# Patient Record
Sex: Male | Born: 1965 | Race: White | Hispanic: Yes | Marital: Married | Smoking: Never smoker
Health system: Southern US, Community
[De-identification: ages and names within clinical notes are randomized; demographics above are authoritative.]

## PROBLEM LIST (undated history)

## (undated) DIAGNOSIS — E7800 Pure hypercholesterolemia, unspecified: Secondary | ICD-10-CM

## (undated) DIAGNOSIS — I10 Essential (primary) hypertension: Secondary | ICD-10-CM

## (undated) HISTORY — DX: Essential (primary) hypertension: I10

## (undated) HISTORY — DX: Pure hypercholesterolemia, unspecified: E78.00

---

## 2007-09-30 HISTORY — PX: COLONOSCOPY: SHX174

## 2008-06-28 ENCOUNTER — Ambulatory Visit: Payer: Self-pay | Admitting: Gastroenterology

## 2008-07-14 ENCOUNTER — Ambulatory Visit: Payer: Self-pay | Admitting: Gastroenterology

## 2008-07-14 ENCOUNTER — Encounter: Payer: Self-pay | Admitting: Gastroenterology

## 2008-07-14 ENCOUNTER — Ambulatory Visit (HOSPITAL_COMMUNITY): Admission: RE | Admit: 2008-07-14 | Discharge: 2008-07-14 | Payer: Self-pay | Admitting: Gastroenterology

## 2008-12-12 ENCOUNTER — Telehealth (INDEPENDENT_AMBULATORY_CARE_PROVIDER_SITE_OTHER): Payer: Self-pay

## 2009-01-09 DIAGNOSIS — Z8719 Personal history of other diseases of the digestive system: Secondary | ICD-10-CM

## 2009-01-09 DIAGNOSIS — R3915 Urgency of urination: Secondary | ICD-10-CM | POA: Insufficient documentation

## 2009-01-09 DIAGNOSIS — B820 Intestinal helminthiasis, unspecified: Secondary | ICD-10-CM

## 2009-01-10 ENCOUNTER — Ambulatory Visit: Payer: Self-pay | Admitting: Gastroenterology

## 2009-01-10 DIAGNOSIS — K648 Other hemorrhoids: Secondary | ICD-10-CM

## 2009-01-10 DIAGNOSIS — D126 Benign neoplasm of colon, unspecified: Secondary | ICD-10-CM | POA: Insufficient documentation

## 2011-02-11 NOTE — H&P (Signed)
NAME:  Bobby Newton, Bobby Newton             ACCOUNT NO.:  [id number]   MEDICAL RECORD NO.:  [id number]          PATIENT TYPE:  AMB   LOCATION:  DAY                           FACILITY:  [facility]   PHYSICIAN:  Kassie Mends, M.D.      DATE OF BIRTH:  1966-05-04   DATE OF ADMISSION:  DATE OF DISCHARGE:  LH                              HISTORY & PHYSICAL   PRIMARY CARE PHYSICIAN:  [facility] Department physician.   CHIEF COMPLAINT:  Hematochezia.   HISTORY OF PRESENT ILLNESS:  Bobby Newton is a 45 year old Hispanic male.  He has a 3-year history of intermittent hematochezia.  It initially  began as a small volume with wiping on the toilet tissue and it was very  rare and he had very rare episode.  More recently, he has been having  large amounts of bright red blood on the toilet tissue with wiping.  He  felt he may have had a hemorrhoid.  He did use some over-the-counter  treatment.  He has also used an over-the-counter powder for  constipation.  He tells me he is having about 3 soft brown bowel  movements per day and intermittently is noticing large amounts of bright  red blood mixed in with the stool and on the toilet paper.  He denies  any nausea or vomiting.  Denies any abdominal pain.  Denies any  proctalgia.  Denies any fever or chills.  He denies any history of  diarrhea.  Denies any heartburn or indigestion.   PAST MEDICAL AND SURGICAL HISTORY:  1. Constipation.  2. Urinary urgency.   CURRENT MEDICATIONS:  Denies any.   ALLERGIES:  No known drug allergies.   FAMILY HISTORY:  There is no known family history of colorectal  carcinoma, liver, or chronic GI problems.  Mother deceased at 78,  unknown etiology.  Father is alive, but the patient is not sure about  any medical problems.  He has 6 healthy siblings.   SOCIAL HISTORY:  Bobby Newton is married.  He has 2 healthy children.  He is  a Therapist, occupational.  He denies any tobacco or drug use.  He rarely  consumes alcohol.   REVIEW OF SYSTEMS:  See HPI, otherwise, negative.   PHYSICAL EXAMINATION:  VITAL SIGNS:  Weight is 160.5 pounds, height 64-  1/2 inches, temp 98.5, blood pressure 140/94, and pulse 56.  GENERAL:  He is a 45 year old Hispanic male who is alert, pleasant, and  cooperative.  He has brought his daughter, Giorgio Chabot along in order to  translate.  HEENT:  Sclerae clear, nonicteric.  Conjunctivae pink.  Oropharynx pink  and moist without any lesions.  NECK:  Supple without any mass or thyromegaly.  HEART:  Regular rate and rhythm.  Normal S1 and S2 without murmurs,  clicks, rubs, or gallops.  LUNGS:  Clear to auscultation bilaterally.  ABDOMEN:  Positive bowel sounds x4.  No bruits auscultated.  Soft,  nontender, and nondistended without palpable mass or hepatosplenomegaly.  No rebound tenderness or guarding.  EXTREMITIES:  Without clubbing or edema.  RECTAL:  No external lesions visualized.  Good sphincter tone.  No  internal masses palpated.  A small amount of medium brown stool was  obtained for the vault, which was Hemoccult negative.   IMPRESSION:  Bobby Newton is a 45 year old Hispanic male with a 3-year  history of intermittent hematochezia, which has worsened over the last 3  years.  He has a benign rectal exam in the office today and is going to  require further evaluation to determine the culprit of his rectal  bleeding.  He may have a benign anorectal source such as fissure or  internal hemorrhoids as the culprit, cannot rule out diverticulitis, or  colorectal carcinoma or polyps without further evaluation with  colonoscopy.   PLAN:  Colonoscopy with Dr. Cira Servant in the near future.  I have discussed  the procedure including the risks and benefits include but not limited  to bleeding, infection, perforation, and drug reaction.  He agrees with  the plan and consent will be obtained.      Lorenza Burton, N.P.      Kassie Mends, M.D.  Electronically Signed    KJ/MEDQ  D:   06/28/2008  T:  06/28/2008  Job:  [id number]   cc:   [facility] Department

## 2011-02-14 NOTE — Op Note (Signed)
Bobby Newton, Bobby Newton             ACCOUNT NO.:  [id number]   MEDICAL RECORD NO.:  [id number]          PATIENT TYPE:  AMB   LOCATION:  DAY                           FACILITY:  [facility]   PHYSICIAN:  Kassie Mends, M.D.      DATE OF BIRTH:  02-10-1966   DATE OF PROCEDURE:  DATE OF DISCHARGE:  07/14/2008                               OPERATIVE REPORT   REFERRING Demar Shad:  [facility] Department.   PROCEDURE:  Ileocolonoscopy with snare cautery and cold forceps  polypectomy, as well as stool collected for O&P.   INDICATION FOR EXAM:  Bobby Newton is a 45 year old male who presented with  a 3-year history of intermittent rectal bleeding.   FINDINGS:  1. 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      Otherwise, no erythema, erosions, or mass seen.  2. A 3-mm descending colon polyp removed via cold forceps.  3. An 8-mm sessile rectal polyp removed via snare cautery.  Otherwise,      no inflammatory changes, masses, diverticular AVMs seen.  4. Normal retroflexed view of the rectum.   DIAGNOSES:  1. Tape worm infection.  2. Rectal polyp likely source of intermittent rectal bleeding.   RECOMMENDATIONS:  1. Colonoscopy in 5 years.  2. Will call Bobby Newton with the results of his biopsies.  3. He should follow a high-fiber diet.  He is given a handout on high-      fiber diet and polyps.  No aspirin, NSAIDs, or anticoagulation for      5 days.  4. He is to take praziquantel 600 mg p.o. x1.  5. The discharge instructions were given via Josephina Gip.  Will call her      at [phone] with the results of his biopsies.  The entire family      should be screened for tape worms.  He was asymptomatic.   MEDICATIONS:  1. Demerol IV 75 mg IV.  2. Versed 4 mg IV.   PROCEDURE TECHNIQUE:  Physical exam was performed.  Informed consent was  obtained from the patient after explaining benefits, risks, and  alternatives to the procedure.  The patient was connected to the monitor  and placed in the left lateral position.  Continuous oxygen was provided  by nasal cannula and IV medicine administered through an indwelling  pain.  After administration of sedation and rectal exam, the patient's  rectum was intubated and the scope was advanced under direct  visualization to the distal terminal ileum.  The scope was removed  slowly by careful examining the color, texture, anatomy and integrity of  the mucosa on the way out.  The patient was recovered in endoscopy and  discharged home in satisfactory condition.   PATH:  Simple adenoma. TCS in 5 years. High fiber diet. First degree  relatives need TCS at age 83. Attempted to contact patient at [phone].  No answer. No asnwering machine. Send letter to ask patient to contact  office for results.      Kassie Mends, M.D.  Electronically Signed     SM/MEDQ  D:  07/18/2008  T:  07/19/2008  Job:  [id number]   cc:   [facility] Department

## 2011-07-01 LAB — OVA AND PARASITE EXAMINATION: Ova and parasites: NONE SEEN

## 2013-07-15 ENCOUNTER — Encounter: Payer: Self-pay | Admitting: Gastroenterology

## 2014-04-12 ENCOUNTER — Encounter: Payer: Self-pay | Admitting: *Deleted

## 2014-05-22 ENCOUNTER — Ambulatory Visit (INDEPENDENT_AMBULATORY_CARE_PROVIDER_SITE_OTHER): Payer: Self-pay | Admitting: Gastroenterology

## 2014-05-22 ENCOUNTER — Encounter: Payer: Self-pay | Admitting: Gastroenterology

## 2014-05-22 ENCOUNTER — Encounter (INDEPENDENT_AMBULATORY_CARE_PROVIDER_SITE_OTHER): Payer: Self-pay

## 2014-05-22 VITALS — BP 104/62 | HR 52 | Temp 97.6°F | Ht 64.0 in | Wt 160.6 lb

## 2014-05-22 DIAGNOSIS — K625 Hemorrhage of anus and rectum: Secondary | ICD-10-CM | POA: Insufficient documentation

## 2014-05-22 DIAGNOSIS — D126 Benign neoplasm of colon, unspecified: Secondary | ICD-10-CM

## 2014-05-22 MED ORDER — PEG 3350-KCL-NA BICARB-NACL 420 G PO SOLR: 4000.0000 mL | ORAL | Status: DC

## 2014-05-22 MED ORDER — HYDROCORTISONE 2.5 % RE CREA: 1.0000 "application " | TOPICAL_CREAM | Freq: Two times a day (BID) | RECTAL | Status: DC

## 2014-05-22 NOTE — Patient Instructions (Signed)
Avoid straining. Use the cream I sent to the pharmacy twice a day for 7 days.   We have scheduled you for a colonoscopy with Dr. Oneida Alar in the near future!

## 2014-05-22 NOTE — Progress Notes (Signed)
    Primary Care Physician:  Soyla Dryer, PA Primary Gastroenterologist:  Dr. Oneida Alar   Chief Complaint  Patient presents with  . Rectal Bleeding    HPI:   Bobby Newton presents today at the request of Soyla Dryer, PA, secondary to need for surveillance colonoscopy. History of simple adenoma in 2009. Plans were for 5 year surveillance.  Non-English speaking. Needs translator. Notes "blister" at rectum, painful. Associated itching. No diarrhea or constipation. Notes BM usually 3 times daily. Feels like the blister fills up with blood like a bag, then ruptures. Feels like a rash. No upper GI symptoms.   Past Medical History  Diagnosis Date  . Hypercholesterolemia   . Hypertension     Past Surgical History  Procedure Laterality Date  . Colonoscopy  2009    Dr. Oneida Alar: multiple tape worms in small intestine and colon. simple adenoma. Surveillance in 5 years    Current Outpatient Prescriptions  Medication Sig Dispense Refill  . simvastatin (ZOCOR) 20 MG tablet Take 20 mg by mouth daily.      . hydrocortisone (ANUSOL-HC) 2.5 % rectal cream Place 1 application rectally 2 (two) times daily.  30 g  1   No current facility-administered medications for this visit.    Allergies as of 05/22/2014  . (No Known Allergies)    Family History  Problem Relation Age of Onset  . Colon cancer Neg Hx     History   Social History  . Marital Status: Married    Spouse Name: N/A    Number of Children: N/A  . Years of Education: N/A   Occupational History  . Not on file.   Social History Main Topics  . Smoking status: Never Smoker   . Smokeless tobacco: Not on file  . Alcohol Use: No  . Drug Use: No  . Sexual Activity: Not on file   Other Topics Concern  . Not on file   Social History Narrative  . No narrative on file    Review of Systems: As mentioned in HPI.   Physical Exam: BP 104/62  Pulse 52  Temp(Src) 97.6 F (36.4 C) (Oral)  Ht 5\' 4"  (1.626 m)  Wt 160  lb 9.6 oz (72.848 kg)  BMI 27.55 kg/m2 General:   Alert and oriented. Pleasant and cooperative. Well-nourished and well-developed.  Head:  Normocephalic and atraumatic. Eyes:  Without icterus, sclera clear and conjunctiva pink.  Ears:  Normal auditory acuity. Nose:  No deformity, discharge,  or lesions. Mouth:  No deformity or lesions, oral mucosa pink.  Lungs:  Clear to auscultation bilaterally. No wheezes, rales, or rhonchi. No distress.  Heart:  S1, S2 present without murmurs appreciated.  Abdomen:  +BS, soft, non-tender and non-distended. No HSM noted. No guarding or rebound. No masses appreciated.  Rectal:  Small non-thrombosed external hemorrhoid, tiny pea-shaped at 6 o'clock. Internal exam without mass or stricture, no gross blood noted. Query internal hemorrhoids Msk:  Symmetrical without gross deformities. Normal posture. Extremities:  Without clubbing or edema. Neurologic:  Alert and  oriented x4;  grossly normal neurologically. Skin:  Intact without significant lesions or rashes. Psych:  Alert and cooperative. Normal mood and affect.

## 2014-05-24 ENCOUNTER — Encounter (HOSPITAL_COMMUNITY): Payer: Self-pay | Admitting: Pharmacy Technician

## 2014-05-25 ENCOUNTER — Encounter: Payer: Self-pay | Admitting: Gastroenterology

## 2014-05-25 NOTE — Assessment & Plan Note (Signed)
Due for surveillance now, with last colonoscopy in 2009.  Proceed with colonoscopy with Dr. Oneida Alar in the near future. The risks, benefits, and alternatives have been discussed in detail with the patient. They state understanding and desire to proceed.

## 2014-05-25 NOTE — Assessment & Plan Note (Signed)
Likely related to internal hemorrhoids. Rectal exam without concerning findings; external hemorrhoid noted but non-thrombosed. Anusol cream BID for supportive measures. May be good candidate for CRH banding.   Proceed with colonoscopy with Dr. Oneida Alar in the near future. The risks, benefits, and alternatives have been discussed in detail with the patient. They state understanding and desire to proceed.

## 2014-05-30 NOTE — Progress Notes (Signed)
Cc to pcp °

## 2014-06-07 ENCOUNTER — Encounter (HOSPITAL_COMMUNITY): Payer: Self-pay | Admitting: *Deleted

## 2014-06-07 ENCOUNTER — Ambulatory Visit (HOSPITAL_COMMUNITY)
Admission: RE | Admit: 2014-06-07 | Discharge: 2014-06-07 | Disposition: A | Discharge status: Home / Self Care | Payer: Self-pay | Source: Ambulatory Visit | Attending: Gastroenterology | Admitting: Gastroenterology

## 2014-06-07 ENCOUNTER — Encounter (HOSPITAL_COMMUNITY)
Admission: RE | Disposition: A | Discharge status: Home / Self Care | Payer: Self-pay | Source: Ambulatory Visit | Attending: Gastroenterology

## 2014-06-07 DIAGNOSIS — D126 Benign neoplasm of colon, unspecified: Secondary | ICD-10-CM | POA: Insufficient documentation

## 2014-06-07 DIAGNOSIS — K648 Other hemorrhoids: Secondary | ICD-10-CM | POA: Insufficient documentation

## 2014-06-07 DIAGNOSIS — K625 Hemorrhage of anus and rectum: Secondary | ICD-10-CM

## 2014-06-07 HISTORY — PX: COLONOSCOPY: SHX5424

## 2014-06-07 SURGERY — COLONOSCOPY
Anesthesia: Moderate Sedation

## 2014-06-07 MED ORDER — ATROPINE SULFATE 1 MG/ML IJ SOLN
INTRAMUSCULAR | Status: AC
  Filled 2014-06-07: qty 1

## 2014-06-07 MED ORDER — MEPERIDINE HCL 100 MG/ML IJ SOLN
INTRAMUSCULAR | Status: DC | PRN
  Administered 2014-06-07 (×2): 25 mg via INTRAVENOUS

## 2014-06-07 MED ORDER — SODIUM CHLORIDE 0.9 % IV SOLN
INTRAVENOUS | Status: DC
  Administered 2014-06-07: 08:00:00 via INTRAVENOUS

## 2014-06-07 MED ORDER — ATROPINE SULFATE 1 MG/ML IJ SOLN
INTRAMUSCULAR | Status: DC | PRN
  Administered 2014-06-07: 1 mg via INTRAVENOUS

## 2014-06-07 MED ORDER — STERILE WATER FOR IRRIGATION IR SOLN
Status: DC | PRN
  Administered 2014-06-07: 09:00:00

## 2014-06-07 MED ORDER — MEPERIDINE HCL 100 MG/ML IJ SOLN
INTRAMUSCULAR | Status: AC
  Filled 2014-06-07: qty 2

## 2014-06-07 MED ORDER — MIDAZOLAM HCL 5 MG/5ML IJ SOLN
INTRAMUSCULAR | Status: DC | PRN
  Administered 2014-06-07 (×2): 2 mg via INTRAVENOUS

## 2014-06-07 MED ORDER — MIDAZOLAM HCL 5 MG/5ML IJ SOLN
INTRAMUSCULAR | Status: AC
  Filled 2014-06-07: qty 10

## 2014-06-07 NOTE — H&P (Signed)
  Primary Care Physician:  No PCP Per Patient Primary Gastroenterologist:  Dr. Oneida Alar  Pre-Procedure History & Physical: HPI:  Bobby Newton is a 48 y.o. male here for  BRBPR.   Past Medical History  Diagnosis Date  . Hypercholesterolemia   . Hypertension     Past Surgical History  Procedure Laterality Date  . Colonoscopy  2009    Dr. Oneida Alar: multiple tape worms in small intestine and colon. simple adenoma. Surveillance in 5 years    Prior to Admission medications   Medication Sig Start Date End Date Taking? Authorizing Provider  hydrocortisone (ANUSOL-HC) 2.5 % rectal cream Place 1 application rectally 2 (two) times daily. 05/22/14  Yes Orvil Feil, NP  simvastatin (ZOCOR) 20 MG tablet Take 20 mg by mouth daily.   Yes Historical Provider, MD    Allergies as of 05/22/2014  . (No Known Allergies)    Family History  Problem Relation Age of Onset  . Colon cancer Neg Hx     History   Social History  . Marital Status: Married    Spouse Name: N/A    Number of Children: N/A  . Years of Education: N/A   Occupational History  . Not on file.   Social History Main Topics  . Smoking status: Never Smoker   . Smokeless tobacco: Not on file  . Alcohol Use: No  . Drug Use: No  . Sexual Activity: Not on file   Other Topics Concern  . Not on file   Social History Narrative  . No narrative on file    Review of Systems: See HPI, otherwise negative ROS   Physical Exam: BP 135/76  Pulse 50  Temp(Src) 97.7 F (36.5 C) (Oral)  Resp 18  Ht 5\' 4"  (1.626 m)  Wt 160 lb (72.576 kg)  BMI 27.45 kg/m2  SpO2 98% General:   Alert,  pleasant and cooperative in NAD Head:  Normocephalic and atraumatic. Neck:  Supple; Lungs:  Clear throughout to auscultation.    Heart:  Regular rate and rhythm. Abdomen:  Soft, nontender and nondistended. Normal bowel sounds, without guarding, and without rebound.   Neurologic:  Alert and  oriented x4;  grossly normal  neurologically.  Impression/Plan:    BRBPR  PLAN: TCS TODAY

## 2014-06-07 NOTE — Discharge Instructions (Addendum)
You had 1 polyp removed. You have internal hemorrhoids.   FOLLOW A HIGH FIBER DIET. AVOID ITEMS THAT CAUSE BLOATING & GAS. SEE INFO BELOW.  YOUR BIOPSY RESULTS SHOULD BE BACK IN 7 DAYS.  Next colonoscopy in 5 years. YOUR SISTERS, BROTHERS, CHILDREN, AND PARENTS NEED TO HAVE A COLONOSCOPY STARTING AT THE AGE OF 40.   Colonoscopy Care After Read the instructions outlined below and refer to this sheet in the next week. These discharge instructions provide you with general information on caring for yourself after you leave the hospital. While your treatment has been planned according to the most current medical practices available, unavoidable complications occasionally occur. If you have any problems or questions after discharge, call DR. De Jaworski, [phone].  ACTIVITY  You may resume your regular activity, but move at a slower pace for the next 24 hours.   Take frequent rest periods for the next 24 hours.   Walking will help get rid of the air and reduce the bloated feeling in your belly (abdomen).   No driving for 24 hours (because of the medicine (anesthesia) used during the test).   You may shower.   Do not sign any important legal documents or operate any machinery for 24 hours (because of the anesthesia used during the test).    NUTRITION  Drink plenty of fluids.   You may resume your normal diet as instructed by your doctor.   Begin with a light meal and progress to your normal diet. Heavy or fried foods are harder to digest and may make you feel sick to your stomach (nauseated).   Avoid alcoholic beverages for 24 hours or as instructed.    MEDICATIONS  You may resume your normal medications.   WHAT YOU CAN EXPECT TODAY  Some feelings of bloating in the abdomen.   Passage of more gas than usual.   Spotting of blood in your stool or on the toilet paper  .  IF YOU HAD POLYPS REMOVED DURING THE COLONOSCOPY:  Eat a soft diet IF YOU HAVE NAUSEA, BLOATING,  ABDOMINAL PAIN, OR VOMITING.    FINDING OUT THE RESULTS OF YOUR TEST Not all test results are available during your visit. DR. Oneida Alar WILL CALL YOU WITHIN 7 DAYS OF YOUR PROCEDUE WITH YOUR RESULTS. Do not assume everything is normal if you have not heard from DR. Lulamae Skorupski IN ONE WEEK, CALL HER OFFICE AT [phone].  SEEK IMMEDIATE MEDICAL ATTENTION AND CALL THE OFFICE: [phone] IF:  You have more than a spotting of blood in your stool.   Your belly is swollen (abdominal distention).   You are nauseated or vomiting.   You have a temperature over 101F.   You have abdominal pain or discomfort that is severe or gets worse throughout the day.   High-Fiber Diet A high-fiber diet changes your normal diet to include more whole grains, legumes, fruits, and vegetables. Changes in the diet involve replacing refined carbohydrates with unrefined foods. The calorie level of the diet is essentially unchanged. The Dietary Reference Intake (recommended amount) for adult males is 38 grams per day. For adult females, it is 25 grams per day. Pregnant and lactating women should consume 28 grams of fiber per day. Fiber is the intact part of a plant that is not broken down during digestion. Functional fiber is fiber that has been isolated from the plant to provide a beneficial effect in the body. PURPOSE  Increase stool bulk.   Ease and regulate bowel movements.   Lower  cholesterol.  INDICATIONS THAT YOU NEED MORE FIBER  Constipation and hemorrhoids.   Uncomplicated diverticulosis (intestine condition) and irritable bowel syndrome.   Weight management.   As a protective measure against hardening of the arteries (atherosclerosis), diabetes, and cancer.   GUIDELINES FOR INCREASING FIBER IN THE DIET  Start adding fiber to the diet slowly. A gradual increase of about 5 more grams (2 slices of whole-wheat bread, 2 servings of most fruits or vegetables, or 1 bowl of high-fiber cereal) per day is  best. Too rapid an increase in fiber may result in constipation, flatulence, and bloating.   Drink enough water and fluids to keep your urine clear or pale yellow. Water, juice, or caffeine-free drinks are recommended. Not drinking enough fluid may cause constipation.   Eat a variety of high-fiber foods rather than one type of fiber.   Try to increase your intake of fiber through using high-fiber foods rather than fiber pills or supplements that contain small amounts of fiber.   The goal is to change the types of food eaten. Do not supplement your present diet with high-fiber foods, but replace foods in your present diet.  INCLUDE A VARIETY OF FIBER SOURCES  Replace refined and processed grains with whole grains, canned fruits with fresh fruits, and incorporate other fiber sources. White rice, white breads, and most bakery goods contain little or no fiber.   Brown whole-grain rice, buckwheat oats, and many fruits and vegetables are all good sources of fiber. These include: broccoli, Brussels sprouts, cabbage, cauliflower, beets, sweet potatoes, white potatoes (skin on), carrots, tomatoes, eggplant, squash, berries, fresh fruits, and dried fruits.   Cereals appear to be the richest source of fiber. Cereal fiber is found in whole grains and bran. Bran is the fiber-rich outer coat of cereal grain, which is largely removed in refining. In whole-grain cereals, the bran remains. In breakfast cereals, the largest amount of fiber is found in those with "bran" in their names. The fiber content is sometimes indicated on the label.   You may need to include additional fruits and vegetables each day.   In baking, for 1 cup white flour, you may use the following substitutions:   1 cup whole-wheat flour minus 2 tablespoons.   1/2 cup white flour plus 1/2 cup whole-wheat flour.   Polyps, Colon  A polyp is extra tissue that grows inside your body. Colon polyps grow in the large intestine. The large  intestine, also called the colon, is part of your digestive system. It is a long, hollow tube at the end of your digestive tract where your body makes and stores stool. Most polyps are not dangerous. They are benign. This means they are not cancerous. But over time, some types of polyps can turn into cancer. Polyps that are smaller than a pea are usually not harmful. But larger polyps could someday become or may already be cancerous. To be safe, doctors remove all polyps and test them.   WHO GETS POLYPS? Anyone can get polyps, but certain people are more likely than others. You may have a greater chance of getting polyps if:  You are over 50.   You have had polyps before.   Someone in your family has had polyps.   Someone in your family has had cancer of the large intestine.   Find out if someone in your family has had polyps. You may also be more likely to get polyps if you:   Eat a lot of fatty foods  Smoke   Drink alcohol   Do not exercise  Eat too much   TREATMENT  The caregiver will remove the polyp during sigmoidoscopy or colonoscopy.    PREVENTION There is not one sure way to prevent polyps. You might be able to lower your risk of getting them if you:  Eat more fruits and vegetables and less fatty food.   Do not smoke.   Avoid alcohol.   Exercise every day.   Lose weight if you are overweight.   Eating more calcium and folate can also lower your risk of getting polyps. Some foods that are rich in calcium are milk, cheese, and broccoli. Some foods that are rich in folate are chickpeas, kidney beans, and spinach.   Hemorrhoids Hemorrhoids are dilated (enlarged) veins around the rectum. Sometimes clots will form in the veins. This makes them swollen and painful. These are called thrombosed hemorrhoids. Causes of hemorrhoids include:  Constipation.   Straining to have a bowel movement.   HEAVY LIFTING HOME CARE INSTRUCTIONS  Eat a well balanced diet and drink  6 to 8 glasses of water every day to avoid constipation. You may also use a bulk laxative.   Avoid straining to have bowel movements.   Keep anal area dry and clean.   Do not use a donut shaped pillow or sit on the toilet for long periods. This increases blood pooling and pain.   Move your bowels when your body has the urge; this will require less straining and will decrease pain and pressure.      Colonoscopa: cuidados posteriores (Colonoscopy, Care After) Siga estas instrucciones durante las prximas semanas. Estas indicaciones le proporcionan informacin general acerca de cmo deber cuidarse despus del procedimiento. El mdico tambin podr darle instrucciones ms especficas. El tratamiento ha sido planificado segn las prcticas mdicas actuales, pero en algunos casos pueden ocurrir problemas. Comunquese con el mdico si tiene algn problema o tiene dudas despus del procedimiento. QU ESPERAR DESPUS DEL PROCEDIMIENTO  Despus del procedimiento, es comn tener las siguientes sensaciones:  Una pequea cantidad de sangre en la materia fecal.  Cantidades moderadas de gases e hinchazn o calambres abdominales leves. INSTRUCCIONES PARA EL CUIDADO EN EL HOGAR  No conduzca vehculos, opere maquinarias ni firme documentos importantes durante 24horas.  Puede ducharse y retomar sus actividades fsicas habituales, pero muvase a un ritmo ms lento durante las primeras 24horas.  Tmese descansos frecuentes durante las primeras 24horas.  Camine o colquese compresas calientes en el abdomen para ayudar a reducir los calambres e hinchazn abdominales.  Beba suficiente lquido para Consulting civil engineer orina clara o de color amarillo plido.  Puede retomar su dieta normal segn las instrucciones de su mdico. Evite los alimentos pesados o fritos que son difciles de Publishing copy.  Evite consumir alcohol durante 24horas o segn las instrucciones de su mdico.  Tome solo medicamentos de venta  libre o recetados, segn las indicaciones del mdico.  Si se obtuvo una muestra de tejido (biopsia) durante el procedimiento:  No tome aspirina ni anticoagulantes durante 7das, o segn las instrucciones de su mdico.  No consuma alcohol durante 7das o segn las instrucciones de su mdico.  Consuma alimentos livianos durante las primeras 24horas. SOLICITE ATENCIN MDICA SI: Tiene manchas persistentes de sangre en la materia fecal entre 2 y 3das posteriores al procedimiento. SOLICITE ATENCIN MDICA DE INMEDIATO SI:  Tiene ms que una pequea mancha de sangre en la materia fecal.  Elimina grandes cogulos de sangre en la materia fecal.  Phylliss Bob  el abdomen hinchado (distendido).  Tiene nuseas o vmitos.  Tiene fiebre.  Siente dolor intenso en el abdomen que no se alivia con los [employer]. Document Released: 12/12/2008 Document Revised: 07/06/2013 [facility] Patient Information 2015 [employer]. This information is not intended to replace advice given to you by your health care provider. Make sure you discuss any questions you have with your health care provider. Dieta con alto contenido de fibra  (High [employer] Diet) La fibra se encuentra en frutas, verduras y granos. Una dieta con alto contenido en fibras se favorece con la adicin de ms granos enteros, legumbres, frutas y verduras en su dieta. La cantidad recomendada de fibra para los hombres adultos es de 38 g por da. Para las mujeres adultas es de 25 g por da. Las [employer] y las que amamantan deben consumir 36 gramos de fibra por Training and development officer. Si usted tiene un problema digestivo o intestinal, consulte a su mdico antes de la adicin de alimentos ricos en fibra a su dieta. Coma una variedad de alimentos ricos en fibra en lugar de slo unos pocos.  OBJETIVO   Aumentar la masa fecal.  Tener deposiciones ms regulares para evitar el estreimiento.  Reducir el colesterol.  Para evitar comer en exceso. [place]?   En caso de estreimiento y hemorroides.  En caso de diverticulosis no complicada (enfermedad intestinal) y en el sndrome del colon irritable.  Si necesita ayuda para el control de [place].  Si desea mejorar su dieta como medida de proteccin contra la aterosclerosis, la diabetes y Science writer. [place] y cereales integrales.  Frutas, como las [place], [place], pltanos, fresas, Development worker, community y peras.  Verduras, como guisantes, zanahorias, batatas, remolachas, brcoli, repollo, espinacas y alcauciles.  Legumbres, las arvejas, soja, lentejas.  Almendras. CONTENIDO DE FIBRA DE LOS ALIMENTOS  Almidones y granos / Heritage manager (g)   Cheerios, 1 taza / 3 g  Corn Flakes, 1 taza / 0,7 g  Arroz inflado, 1  tazas / 0,3 g  Harina de avena instantnea (cocida),  taza / 2 g  Cereal de trigo escarchado, 1 taza / 5,1 g  Arroz marrn grano largo (cocido), 1 taza / 3,5 g  Arroz blanco grano largo (cocido), 1 taza / 0,6 g  Macarrones enriquecidos (cocidos), 1 taza / 2,5 g Legumbres / Fibra Diettica (g)   Frijoles cocidos (enlatados, crudos o vegetarianos),  taza / 5,2 g  Frijoles (enlatados),  taza / 6,8 g  Frijoles pintos (cocidos),  taza / 5,5 g Panes y Administrator / Heritage manager (g)   Galletas de graham o miel, 2 plazas / 0,7 g  Galletitas saladas, 3 unidades / 0,3 g  Pretzels salados comunes, 10 pedazos / 1,8 g  Pan integral, 1 rebanada / 1,9 g  Pan blanco, 1 rebanada / 0,7 g  Pan con pasas, 1 rebanada / 1,2 g  Bagel 3 oz / 2 g  Tortilla de harina, 1 oz / 0.9 g  Tortilla de maz, 1 pequea / 1,5 g  Pan de amburguesa o hot dog, 1 pequeo / 0,9 g Frutas / Fibra Diettica (g)   Manzana con piel, 1 mediana / 4,4 g  Pur de [employer],  taza / 1,5 g  [place],  mediano / 1,5 g  Uvas, 10 uvas / 0,4 g  Naranja, 1 pequea / 2,3 g  Pasas, 1,5 oz / 1.6 g  Meln, 1 taza / 1,4 g Vegetales / Fibra Diettica (g)  Judas verdes (en conserva),  taza / 1,3 g  Zanahorias (cocido),  taza / 2,3 g  Broccoli (cocido),  taza / 2,8 g  Guisantes (cocidos),  taza / 4,4 g  Pur de papas,  taza / 1,6 g  Lechuga, 1 taza / 0,5 g  Maz (en lata),  taza / 1,6 g  Tomate,  taza / 1,1 g 1 cup / 3 g. Document Released: 09/15/2005 Document Revised: 03/16/2012 [facility] Patient Information 2015 [place], [place]. This information is not intended to replace advice given to you by your health care provider. Make sure you discuss any questions you have with your health care provider.

## 2014-06-07 NOTE — OR Nursing (Signed)
Randel Pigg with Language Resources here to interpret for patient.

## 2014-06-07 NOTE — Op Note (Signed)
[facility] [address] [place], [address]   COLONOSCOPY PROCEDURE REPORT  PATIENT: Bobby Newton, Bobby Newton  MR#: [id number] BIRTHDATE: Feb 12, 1966 , 26  yrs. old GENDER: Male ENDOSCOPIST: Barney Drain, MD REFERRED [id number] McElroy, PA-C PROCEDURE DATE:  06/07/2014 PROCEDURE:   Colonoscopy with cold biopsy polypectomy INDICATIONS:Rectal Bleeding. MEDICATIONS: Demerol 50 mg IV and Versed 4 mg IV  DESCRIPTION OF PROCEDURE:    Physical exam was performed.  Informed consent was obtained from the patient after explaining the benefits, risks, and alternatives to procedure.  The patient was connected to monitor and placed in left lateral position. Continuous oxygen was provided by nasal cannula and IV medicine administered through an indwelling cannula.  After administration of sedation and rectal exam, the patients rectum was intubated and the EC-[id number] ([id number])  colonoscope was advanced under direct visualization to the ileum.  The scope was removed slowly by carefully examining the color, texture, anatomy, and integrity mucosa on the way out.  The patient was recovered in endoscopy and discharged home in satisfactory condition.     COLON FINDINGS: The mucosa appeared normal in the terminal ileum.  , A sessile polyp measuring 4 mm in size was found in the descending colon.  A polypectomy was performed with cold forceps.  , and Moderate sized internal hemorrhoids were found.  PREP QUALITY: excellent.  CECAL W/D TIME: 12 minutes COMPLICATIONS: HR DROPPED TO 30 AND GIVEN ATROPINE 1 MG IV-->HR INCREASED TO 84.  ENDOSCOPIC IMPRESSION: 1.   Normal mucosa in the terminal ileum 2.   ONE COLON POLYP REMOVED 3.   RECTAL BLEEDING DUE TO Moderate sized internal hemorrhoids  RECOMMENDATIONS: FOLLOW A HIGH FIBER DIET.  AVOID ITEMS THAT CAUSE BLOATING & GAS. BIOPSY RESULTS SHOULD BE BACK IN 7 DAYS.  Next colonoscopy in 5 years.  YOUR SISTERS, BROTHERS, CHILDREN,  AND PARENTS NEED TO HAVE A COLONOSCOPY STARTING AT THE AGE OF 40.       _______________________________ Lorrin MaisBarney Drain, MD 06/07/2014 9:41 AM

## 2014-06-12 ENCOUNTER — Encounter (HOSPITAL_COMMUNITY): Payer: Self-pay | Admitting: Gastroenterology

## 2014-06-19 ENCOUNTER — Telehealth: Payer: Self-pay | Admitting: Gastroenterology

## 2014-06-19 NOTE — Telephone Encounter (Signed)
Pt's daughter called today for patient. Patient had procedure done 9/9 by SF and was written a prescription for a cream (doesn't know the name). She said that the patient told SF that he already had the cream at home (proctozone/HC) daughter doesn't think this is the same thing that SF recommended. If it isn't she would like to either pick up the prescription here so they can check around with which pharmacy would be cheaper for them or we can send it to [facility]. Please advise and if you have any questions you can call daughter at [phone]

## 2014-06-19 NOTE — Telephone Encounter (Signed)
I spoke to Bobby Newton's daughter, Olga< and she said Bobby Newton would like to know if it is OK for him to use the Proctozone that he has or does he need something different. If he needs something different, he would like a written prescription so he can shop around for the most economical. Please advise!

## 2014-06-20 NOTE — Telephone Encounter (Signed)
Yes, proctozone is fine.

## 2014-06-20 NOTE — Telephone Encounter (Signed)
I called and informed Michelene Heady. She asked if he uses it on a regular basis or as needed. Please advise!

## 2014-06-20 NOTE — Telephone Encounter (Signed)
Called and informed Bobby Newton.

## 2014-06-20 NOTE — Telephone Encounter (Signed)
Use BID X 7 days.

## 2014-06-21 NOTE — Telephone Encounter (Signed)
Michelene Heady is aware.

## 2014-06-21 NOTE — Telephone Encounter (Signed)
PLEASE CALL PT'S DAUGHTER. HE MAY USE PROCTOZONE BID FOR 10 DAYS BUT HE SHOULD NOT USE IT MORE THAN 30 DAYS A YEAR.

## 2014-06-29 NOTE — Telephone Encounter (Signed)
Reminder in epic °

## 2014-06-29 NOTE — Progress Notes (Signed)
REVIEWED.  

## 2014-06-29 NOTE — Telephone Encounter (Signed)
Please call pt. He had A simple adenoma removed.  FOLLOW A HIGH FIBER DIET. AVOID ITEMS THAT CAUSE BLOATING & GAS.  Next colonoscopy in 5 years. YOUR SISTERS, BROTHERS, CHILDREN, AND PARENTS NEED TO HAVE A COLONOSCOPY STARTING AT THE AGE OF 40.

## 2014-06-29 NOTE — Telephone Encounter (Signed)
Called and informed daughter, Michelene Heady.

## 2015-07-20 ENCOUNTER — Other Ambulatory Visit: Payer: Self-pay | Admitting: Physician Assistant

## 2015-07-20 LAB — COMPREHENSIVE METABOLIC PANEL
ALK PHOS: 64 U/L (ref 40–115)
ALT: 29 U/L (ref 9–46)
AST: 26 U/L (ref 10–40)
Albumin: 4 g/dL (ref 3.6–5.1)
BUN: 14 mg/dL (ref 7–25)
CALCIUM: 8.5 mg/dL — AB (ref 8.6–10.3)
CO2: 24 mmol/L (ref 20–31)
Chloride: 106 mmol/L (ref 98–110)
Creat: 0.89 mg/dL (ref 0.60–1.35)
Glucose, Bld: 92 mg/dL (ref 65–99)
POTASSIUM: 4.6 mmol/L (ref 3.5–5.3)
Sodium: 140 mmol/L (ref 135–146)
Total Bilirubin: 0.6 mg/dL (ref 0.2–1.2)
Total Protein: 6.7 g/dL (ref 6.1–8.1)

## 2015-07-20 LAB — LIPID PANEL
CHOL/HDL RATIO: 3.6 ratio (ref <=5.0)
CHOLESTEROL: 151 mg/dL (ref 125–200)
HDL: 42 mg/dL (ref >=40)
LDL Cholesterol: 89 mg/dL (ref <130)
TRIGLYCERIDES: 102 mg/dL (ref <150)
VLDL: 20 mg/dL (ref <30)

## 2015-07-23 ENCOUNTER — Encounter: Payer: Self-pay | Admitting: Physician Assistant

## 2015-07-23 ENCOUNTER — Ambulatory Visit: Payer: Self-pay | Admitting: Physician Assistant

## 2015-07-23 VITALS — BP 116/68 | HR 50 | Temp 97.9°F | Ht 63.5 in | Wt 150.2 lb

## 2015-07-23 DIAGNOSIS — E785 Hyperlipidemia, unspecified: Secondary | ICD-10-CM

## 2015-07-23 NOTE — Progress Notes (Signed)
   BP 116/68 mmHg  Pulse 50  Temp(Src) 97.9 F (36.6 C)  Ht 5' 3.5" (1.613 m)  Wt 150 lb 3.2 oz (68.13 kg)  BMI 26.19 kg/m2  SpO2 98%   Subjective:    Patient ID: Bobby Newton, male    DOB: 07/02/1966, 49 y.o.   MRN: [id number]  HPI: Bobby Newton is a 49 y.o. male presenting on 07/23/2015 for Gastroesophageal Reflux and Hyperlipidemia   HPI Chief Complaint  Patient presents with  . Gastroesophageal Reflux  . Hyperlipidemia   Pt stopped his protonix b/c his stomach doesn't hurt any more  Relevant past medical, surgical, family and social history reviewed and updated as indicated. Interim medical history since our last visit reviewed. Allergies and medications reviewed and updated.   Current outpatient prescriptions:  .  simvastatin (ZOCOR) 20 MG tablet, Take 20 mg by mouth daily., Disp: , Rfl:    Review of Systems  Constitutional: Negative for fever, chills, diaphoresis, appetite change, fatigue and unexpected weight change.  HENT: Positive for dental problem and sneezing. Negative for congestion, drooling, ear pain, facial swelling, hearing loss, mouth sores, sore throat, trouble swallowing and voice change.   Eyes: Positive for pain and redness. Negative for discharge, itching and visual disturbance.  Respiratory: Negative for cough, choking, shortness of breath and wheezing.   Cardiovascular: Negative for chest pain, palpitations and leg swelling.  Gastrointestinal: Positive for constipation. Negative for vomiting, abdominal pain, diarrhea and blood in stool.  Endocrine: Negative for cold intolerance, heat intolerance and polydipsia.  Genitourinary: Negative for dysuria, hematuria and decreased urine volume.  Musculoskeletal: Negative for back pain, arthralgias and gait problem.  Skin: Negative for rash.  Allergic/Immunologic: Negative for environmental allergies.  Neurological: Negative for seizures, syncope, light-headedness and headaches.  Hematological:  Negative for adenopathy.  Psychiatric/Behavioral: Negative for suicidal ideas, dysphoric mood and agitation. The patient is not nervous/anxious.     Per HPI unless specifically indicated above     Objective:    BP 116/68 mmHg  Pulse 50  Temp(Src) 97.9 F (36.6 C)  Ht 5' 3.5" (1.613 m)  Wt 150 lb 3.2 oz (68.13 kg)  BMI 26.19 kg/m2  SpO2 98%  Wt Readings from Last 3 Encounters:  07/23/15 150 lb 3.2 oz (68.13 kg)  06/07/14 160 lb (72.576 kg)  05/22/14 160 lb 9.6 oz (72.848 kg)    Physical Exam  Constitutional: He is oriented to person, place, and time. He appears well-developed and well-nourished.  HENT:  Head: Normocephalic and atraumatic.  Neck: Neck supple.  Cardiovascular: Normal rate and regular rhythm.   Pulmonary/Chest: Effort normal and breath sounds normal. He has no wheezes.  Abdominal: Soft. Bowel sounds are normal. There is no tenderness.  Musculoskeletal: He exhibits no edema.  Lymphadenopathy:    He has no cervical adenopathy.  Neurological: He is alert and oriented to person, place, and time.  Skin: Skin is warm and dry.  Psychiatric: He has a normal mood and affect. His behavior is normal.  Vitals reviewed.    Reviewed labs with pt. Lipids and lft's good   Assessment & Plan:   Encounter Diagnosis  Name Primary?  . Hyperlipemia Yes    Cont simvastatin F/u 6 mo. rto sooner prn

## 2015-07-31 ENCOUNTER — Other Ambulatory Visit: Payer: Self-pay | Admitting: Physician Assistant

## 2015-07-31 MED ORDER — SIMVASTATIN 20 MG PO TABS: 20.0000 mg | ORAL_TABLET | Freq: Every day | ORAL | Status: DC

## 2015-10-23 ENCOUNTER — Other Ambulatory Visit: Payer: Self-pay | Admitting: Physician Assistant

## 2015-10-23 MED ORDER — SIMVASTATIN 20 MG PO TABS: 20.0000 mg | ORAL_TABLET | Freq: Every day | ORAL | Status: DC

## 2016-01-21 ENCOUNTER — Ambulatory Visit: Payer: Self-pay | Admitting: Physician Assistant

## 2016-01-23 ENCOUNTER — Encounter: Payer: Self-pay | Admitting: Physician Assistant

## 2016-01-30 ENCOUNTER — Encounter: Payer: Self-pay | Admitting: Physician Assistant

## 2016-01-30 ENCOUNTER — Ambulatory Visit: Payer: Self-pay | Admitting: Physician Assistant

## 2016-01-30 VITALS — BP 126/76 | HR 47 | Temp 97.7°F | Ht 63.5 in | Wt 152.0 lb

## 2016-01-30 DIAGNOSIS — K219 Gastro-esophageal reflux disease without esophagitis: Secondary | ICD-10-CM | POA: Insufficient documentation

## 2016-01-30 DIAGNOSIS — E785 Hyperlipidemia, unspecified: Secondary | ICD-10-CM | POA: Insufficient documentation

## 2016-01-30 NOTE — Progress Notes (Signed)
   BP 126/76 mmHg  Pulse 47  Temp(Src) 97.7 F (36.5 C)  Ht 5' 3.5" (1.613 m)  Wt 152 lb (68.947 kg)  BMI 26.50 kg/m2  SpO2 99%   Subjective:    Patient ID: Bobby Newton, male    DOB: [birth year], 50 y.o.   MRN: [id number]  HPI: Bobby Newton is a 50 y.o. male presenting on 01/30/2016 for Hyperlipidemia   HPI   Chief Complaint  Patient presents with  . Hyperlipidemia    pt states simvastatin caused his stomach to hurt and burn so he quit taking it.   Pt states abdominal pain gone now  Relevant past medical, surgical, family and social history reviewed and updated as indicated. Interim medical history since our last visit reviewed. Allergies and medications reviewed and updated.  CURRENT MEDS: none  Review of Systems  Constitutional: Negative for fever, chills, diaphoresis, appetite change, fatigue and unexpected weight change.  HENT: Positive for dental problem. Negative for congestion, drooling, ear pain, facial swelling, hearing loss, mouth sores, sneezing, sore throat, trouble swallowing and voice change.   Eyes: Negative for pain, discharge, redness, itching and visual disturbance.  Respiratory: Negative for cough, choking, shortness of breath and wheezing.   Cardiovascular: Negative for chest pain, palpitations and leg swelling.  Gastrointestinal: Negative for vomiting, abdominal pain, diarrhea, constipation and blood in stool.  Endocrine: Negative for cold intolerance, heat intolerance and polydipsia.  Genitourinary: Negative for dysuria, hematuria and decreased urine volume.  Musculoskeletal: Negative for back pain, arthralgias and gait problem.  Skin: Negative for rash.  Allergic/Immunologic: Negative for environmental allergies.  Neurological: Negative for seizures, syncope, light-headedness and headaches.  Hematological: Negative for adenopathy.  Psychiatric/Behavioral: Negative for suicidal ideas, dysphoric mood and agitation. The patient is nervous/anxious.      Per HPI unless specifically indicated above     Objective:    BP 126/76 mmHg  Pulse 47  Temp(Src) 97.7 F (36.5 C)  Ht 5' 3.5" (1.613 m)  Wt 152 lb (68.947 kg)  BMI 26.50 kg/m2  SpO2 99%  Wt Readings from Last 3 Encounters:  01/30/16 152 lb (68.947 kg)  07/23/15 150 lb 3.2 oz (68.13 kg)  06/07/14 160 lb (72.576 kg)    Physical Exam  Constitutional: He is oriented to person, place, and time. He appears well-developed and well-nourished.  HENT:  Head: Normocephalic and atraumatic.  Neck: Neck supple.  Cardiovascular: Normal rate and regular rhythm.   Pulmonary/Chest: Effort normal and breath sounds normal. He has no wheezes.  Abdominal: Soft. Bowel sounds are normal. There is no hepatosplenomegaly. There is no tenderness.  Musculoskeletal: He exhibits no edema.  Lymphadenopathy:    He has no cervical adenopathy.  Neurological: He is alert and oriented to person, place, and time.  Skin: Skin is warm and dry.  Psychiatric: He has a normal mood and affect. His behavior is normal.  Vitals reviewed.       Assessment & Plan:   Encounter Diagnoses  Name Primary?  . Hyperlipidemia Yes  . Gastroesophageal reflux disease, esophagitis presence not specified     -Fasting labs tomorrow morning.  Will call with results. -F/u 6 months although discussed with pt may need to adjust appointment based on results of labs

## 2016-01-31 LAB — COMPLETE METABOLIC PANEL WITH GFR
ALBUMIN: 4.3 g/dL (ref 3.6–5.1)
ALT: 27 U/L (ref 9–46)
AST: 27 U/L (ref 10–40)
Alkaline Phosphatase: 60 U/L (ref 40–115)
BUN: 18 mg/dL (ref 7–25)
CO2: 24 mmol/L (ref 20–31)
CREATININE: 0.96 mg/dL (ref 0.60–1.35)
Calcium: 8.9 mg/dL (ref 8.6–10.3)
Chloride: 106 mmol/L (ref 98–110)
GFR, Est Non African American: >89 mL/min (ref >=60)
Glucose, Bld: 103 mg/dL — ABNORMAL HIGH (ref 65–99)
Potassium: 4.4 mmol/L (ref 3.5–5.3)
Sodium: 140 mmol/L (ref 135–146)
TOTAL PROTEIN: 6.7 g/dL (ref 6.1–8.1)
Total Bilirubin: 0.6 mg/dL (ref 0.2–1.2)

## 2016-01-31 LAB — LIPID PANEL
CHOL/HDL RATIO: 5.3 ratio — AB (ref <=5.0)
CHOLESTEROL: 252 mg/dL — AB (ref 125–200)
HDL: 48 mg/dL (ref >=40)
LDL Cholesterol: 166 mg/dL — ABNORMAL HIGH (ref <130)
TRIGLYCERIDES: 188 mg/dL — AB (ref <150)
VLDL: 38 mg/dL — AB (ref <30)

## 2016-02-02 ENCOUNTER — Other Ambulatory Visit: Payer: Self-pay | Admitting: Physician Assistant

## 2016-02-19 ENCOUNTER — Other Ambulatory Visit: Payer: Self-pay | Admitting: Physician Assistant

## 2016-02-19 MED ORDER — ATORVASTATIN CALCIUM 20 MG PO TABS: 20.0000 mg | ORAL_TABLET | Freq: Every day | ORAL | Status: DC

## 2016-03-31 ENCOUNTER — Other Ambulatory Visit: Payer: Self-pay | Admitting: Physician Assistant

## 2016-03-31 MED ORDER — ATORVASTATIN CALCIUM 20 MG PO TABS: 20.0000 mg | ORAL_TABLET | Freq: Every day | ORAL | Status: DC

## 2016-06-30 ENCOUNTER — Ambulatory Visit: Payer: Self-pay

## 2016-08-04 ENCOUNTER — Encounter: Payer: Self-pay | Admitting: Physician Assistant

## 2016-08-04 ENCOUNTER — Ambulatory Visit: Payer: Self-pay | Admitting: Physician Assistant

## 2016-08-04 VITALS — BP 124/74 | HR 54 | Temp 97.9°F | Ht 63.5 in | Wt 159.8 lb

## 2016-08-04 DIAGNOSIS — Z125 Encounter for screening for malignant neoplasm of prostate: Secondary | ICD-10-CM

## 2016-08-04 DIAGNOSIS — E785 Hyperlipidemia, unspecified: Secondary | ICD-10-CM

## 2016-08-04 LAB — COMPREHENSIVE METABOLIC PANEL
ALK PHOS: 67 U/L (ref 40–115)
ALT: 39 U/L (ref 9–46)
AST: 27 U/L (ref 10–35)
Albumin: 4.2 g/dL (ref 3.6–5.1)
BILIRUBIN TOTAL: 0.5 mg/dL (ref 0.2–1.2)
BUN: 10 mg/dL (ref 7–25)
CO2: 23 mmol/L (ref 20–31)
Calcium: 8.8 mg/dL (ref 8.6–10.3)
Chloride: 106 mmol/L (ref 98–110)
Creat: 0.97 mg/dL (ref 0.70–1.33)
GLUCOSE: 105 mg/dL — AB (ref 65–99)
POTASSIUM: 4.3 mmol/L (ref 3.5–5.3)
Sodium: 140 mmol/L (ref 135–146)
TOTAL PROTEIN: 6.5 g/dL (ref 6.1–8.1)

## 2016-08-04 LAB — LIPID PANEL
CHOL/HDL RATIO: 4.3 ratio (ref <5.0)
CHOLESTEROL: 178 mg/dL (ref <200)
HDL: 41 mg/dL (ref >40)
LDL Cholesterol: 77 mg/dL
Triglycerides: 298 mg/dL — ABNORMAL HIGH (ref <150)
VLDL: 60 mg/dL — ABNORMAL HIGH (ref <30)

## 2016-08-04 LAB — PSA: PSA: 0.4 ng/mL (ref <=4.0)

## 2016-08-04 NOTE — Progress Notes (Signed)
BP 124/74 (BP Location: Left Arm, Patient Position: Sitting, Cuff Size: Normal)   Pulse (!) 54   Temp 97.9 F (36.6 C)   Ht 5' 3.5" (1.613 m)   Wt 159 lb 12 oz (72.5 kg)   SpO2 98%   BMI 27.85 kg/m    Subjective:    Patient ID: Bobby Newton, male    DOB: May 06, 1966, 51 y.o.   MRN: [id number]  HPI: Bobby Newton is a 50 y.o. male presenting on 08/04/2016 for Hyperlipidemia and Gastroesophageal Reflux   HPI   Pt taking lipitor without problem.  He says his stomach burns if he doesn't take his lipitor right after eating but when he does this, he feels well.  Relevant past medical, surgical, family and social history reviewed and updated as indicated. Interim medical history since our last visit reviewed. Allergies and medications reviewed and updated.   Current Outpatient Prescriptions:  .  atorvastatin (LIPITOR) 20 MG tablet, Take 1 tablet (20 mg total) by mouth daily. Tome una tableta por boca al dormir, Disp: 90 tablet, Rfl: 2   Review of Systems  Constitutional: Negative for appetite change, chills, diaphoresis, fatigue, fever and unexpected weight change.  HENT: Positive for dental problem. Negative for congestion, drooling, ear pain, facial swelling, hearing loss, mouth sores, sneezing, sore throat, trouble swallowing and voice change.   Eyes: Positive for discharge and itching. Negative for pain, redness and visual disturbance.  Respiratory: Negative for cough, choking, shortness of breath and wheezing.   Cardiovascular: Negative for chest pain, palpitations and leg swelling.  Gastrointestinal: Negative for abdominal pain, blood in stool, constipation, diarrhea and vomiting.  Endocrine: Negative for cold intolerance, heat intolerance and polydipsia.  Genitourinary: Negative for decreased urine volume, dysuria and hematuria.  Musculoskeletal: Positive for back pain. Negative for arthralgias and gait problem.  Skin: Negative for rash.  Allergic/Immunologic: Negative  for environmental allergies.  Neurological: Negative for seizures, syncope, light-headedness and headaches.  Hematological: Negative for adenopathy.  Psychiatric/Behavioral: Negative for agitation, dysphoric mood and suicidal ideas. The patient is not nervous/anxious.     Per HPI unless specifically indicated above     Objective:    BP 124/74 (BP Location: Left Arm, Patient Position: Sitting, Cuff Size: Normal)   Pulse (!) 54   Temp 97.9 F (36.6 C)   Ht 5' 3.5" (1.613 m)   Wt 159 lb 12 oz (72.5 kg)   SpO2 98%   BMI 27.85 kg/m   Wt Readings from Last 3 Encounters:  08/04/16 159 lb 12 oz (72.5 kg)  01/30/16 152 lb (68.9 kg)  07/23/15 150 lb 3.2 oz (68.1 kg)    Physical Exam  Constitutional: He is oriented to person, place, and time. He appears well-developed and well-nourished.  HENT:  Head: Normocephalic and atraumatic.  Neck: Neck supple.  Cardiovascular: Normal rate and regular rhythm.   Pulmonary/Chest: Effort normal and breath sounds normal. He has no wheezes.  Abdominal: Soft. Bowel sounds are normal. There is no hepatosplenomegaly. There is no tenderness.  Musculoskeletal: He exhibits no edema.  Lymphadenopathy:    He has no cervical adenopathy.  Neurological: He is alert and oriented to person, place, and time.  Skin: Skin is warm and dry.  Psychiatric: He has a normal mood and affect. His behavior is normal.  Vitals reviewed.       Assessment & Plan:   Encounter Diagnoses  Name Primary?  . Hyperlipidemia, unspecified hyperlipidemia type Yes  . Screening for prostate cancer     -  Get fasting labs drawn today when leaves office -Will call with results -rediscussed [facility] to get lipitor at no cost. Pt to continue the lipitor -F/u 3 months.  RTO sooner prn

## 2016-08-05 ENCOUNTER — Other Ambulatory Visit: Payer: Self-pay | Admitting: Physician Assistant

## 2016-08-05 DIAGNOSIS — E785 Hyperlipidemia, unspecified: Secondary | ICD-10-CM

## 2016-10-27 ENCOUNTER — Other Ambulatory Visit: Payer: Self-pay | Admitting: Student

## 2016-10-27 DIAGNOSIS — E785 Hyperlipidemia, unspecified: Secondary | ICD-10-CM

## 2016-10-30 LAB — LIPID PANEL
CHOL/HDL RATIO: 3.7 ratio (ref <5.0)
Cholesterol: 157 mg/dL (ref <200)
HDL: 43 mg/dL (ref >40)
LDL CALC: 81 mg/dL (ref <100)
Triglycerides: 163 mg/dL — ABNORMAL HIGH (ref <150)
VLDL: 33 mg/dL — ABNORMAL HIGH (ref <30)

## 2016-10-30 LAB — COMPREHENSIVE METABOLIC PANEL
ALT: 30 U/L (ref 9–46)
AST: 25 U/L (ref 10–35)
Albumin: 4 g/dL (ref 3.6–5.1)
Alkaline Phosphatase: 68 U/L (ref 40–115)
BILIRUBIN TOTAL: 0.6 mg/dL (ref 0.2–1.2)
BUN: 13 mg/dL (ref 7–25)
CO2: 29 mmol/L (ref 20–31)
Calcium: 8.7 mg/dL (ref 8.6–10.3)
Chloride: 107 mmol/L (ref 98–110)
Creat: 0.95 mg/dL (ref 0.70–1.33)
Glucose, Bld: 91 mg/dL (ref 65–99)
POTASSIUM: 4.1 mmol/L (ref 3.5–5.3)
Sodium: 142 mmol/L (ref 135–146)
Total Protein: 6.7 g/dL (ref 6.1–8.1)

## 2016-11-04 ENCOUNTER — Encounter: Payer: Self-pay | Admitting: Physician Assistant

## 2016-11-04 ENCOUNTER — Ambulatory Visit: Payer: Self-pay | Admitting: Physician Assistant

## 2016-11-04 VITALS — BP 108/70 | HR 52 | Temp 97.9°F | Ht 63.5 in | Wt 156.0 lb

## 2016-11-04 DIAGNOSIS — E785 Hyperlipidemia, unspecified: Secondary | ICD-10-CM

## 2016-11-04 NOTE — Progress Notes (Signed)
BP 108/70 (BP Location: Left Arm, Patient Position: Sitting, Cuff Size: Normal)   Pulse (!) 52   Temp 97.9 F (36.6 C) (Other (Comment))   Ht 5' 3.5" (1.613 m)   Wt 156 lb (70.8 kg)   SpO2 99%   BMI 27.20 kg/m    Subjective:    Patient ID: Bobby Newton Male, male    DOB: [birth year], 52 y.o.   MRN: [id number]  HPI: Bobby Newton is a 51 y.o. male presenting on 11/04/2016 for Hyperlipidemia   HPI   Pt is feeling well and has no complaints today  Relevant past medical, surgical, family and social history reviewed and updated as indicated. Interim medical history since our last visit reviewed. Allergies and medications reviewed and updated.   Current Outpatient Prescriptions:  .  atorvastatin (LIPITOR) 20 MG tablet, Take 1 tablet (20 mg total) by mouth daily. Tome una tableta por boca al dormir, Disp: 90 tablet, Rfl: 2 .  Omega-3 Fatty Acids (FISH OIL PO), Take by mouth., Disp: , Rfl:   Review of Systems  Constitutional: Negative for appetite change, chills, diaphoresis, fatigue, fever and unexpected weight change.  HENT: Negative for congestion, dental problem, drooling, ear pain, facial swelling, hearing loss, mouth sores, sneezing, sore throat, trouble swallowing and voice change.   Eyes: Positive for redness. Negative for pain, discharge, itching and visual disturbance.  Respiratory: Negative for cough, choking, shortness of breath and wheezing.   Cardiovascular: Negative for chest pain, palpitations and leg swelling.  Gastrointestinal: Negative for abdominal pain, blood in stool, constipation, diarrhea and vomiting.  Endocrine: Negative for cold intolerance, heat intolerance and polydipsia.  Genitourinary: Negative for decreased urine volume, dysuria and hematuria.  Musculoskeletal: Negative for arthralgias, back pain and gait problem.  Skin: Negative for rash.  Allergic/Immunologic: Negative for environmental allergies.  Neurological: Negative for seizures, syncope,  light-headedness and headaches.  Hematological: Negative for adenopathy.  Psychiatric/Behavioral: Negative for agitation, dysphoric mood and suicidal ideas. The patient is not nervous/anxious.     Per HPI unless specifically indicated above     Objective:    BP 108/70 (BP Location: Left Arm, Patient Position: Sitting, Cuff Size: Normal)   Pulse (!) 52   Temp 97.9 F (36.6 C) (Other (Comment))   Ht 5' 3.5" (1.613 m)   Wt 156 lb (70.8 kg)   SpO2 99%   BMI 27.20 kg/m   Wt Readings from Last 3 Encounters:  11/04/16 156 lb (70.8 kg)  08/04/16 159 lb 12 oz (72.5 kg)  01/30/16 152 lb (68.9 kg)    Physical Exam  Constitutional: He is oriented to person, place, and time. He appears well-developed and well-nourished.  HENT:  Head: Normocephalic and atraumatic.  Neck: Neck supple.  Cardiovascular: Normal rate and regular rhythm.   Pulmonary/Chest: Effort normal and breath sounds normal. He has no wheezes.  Abdominal: Soft. Bowel sounds are normal. There is no hepatosplenomegaly. There is no tenderness.  Musculoskeletal: He exhibits no edema.  Lymphadenopathy:    He has no cervical adenopathy.  Neurological: He is alert and oriented to person, place, and time.  Skin: Skin is warm and dry.  Psychiatric: He has a normal mood and affect. His behavior is normal.  Vitals reviewed.   Results for orders placed or performed in visit on 10/27/16  Comprehensive metabolic panel  Result Value Ref Range   Sodium 142 135 - 146 mmol/L   Potassium 4.1 3.5 - 5.3 mmol/L   Chloride 107 98 - 110 mmol/L   CO2  29 20 - 31 mmol/L   Glucose, Bld 91 65 - 99 mg/dL   BUN 13 7 - 25 mg/dL   Creat 0.95 0.70 - 1.33 mg/dL   Total Bilirubin 0.6 0.2 - 1.2 mg/dL   Alkaline Phosphatase 68 40 - 115 U/L   AST 25 10 - 35 U/L   ALT 30 9 - 46 U/L   Total Protein 6.7 6.1 - 8.1 g/dL   Albumin 4.0 3.6 - 5.1 g/dL   Calcium 8.7 8.6 - 10.3 mg/dL  Lipid Profile  Result Value Ref Range   Cholesterol 157 <200 mg/dL    Triglycerides 163 (H) <150 mg/dL   HDL 43 >40 mg/dL   Total CHOL/HDL Ratio 3.7 <5.0 Ratio   VLDL 33 (H) <30 mg/dL   LDL Cholesterol 81 <100 mg/dL      Assessment & Plan:   Encounter Diagnosis  Name Primary?  . Hyperlipidemia, unspecified hyperlipidemia type Yes     -reviewed labs with pt.  The look great today.  Pt to continue current medications and lowfat diet -follow up 3 months.  RTO sooner prn

## 2017-01-28 ENCOUNTER — Other Ambulatory Visit (HOSPITAL_COMMUNITY)
Admission: RE | Admit: 2017-01-28 | Discharge: 2017-01-28 | Disposition: A | Discharge status: Home / Self Care | Payer: Self-pay | Source: Ambulatory Visit | Attending: Physician Assistant | Admitting: Physician Assistant

## 2017-01-28 LAB — COMPREHENSIVE METABOLIC PANEL
ALT: 37 U/L (ref 17–63)
AST: 37 U/L (ref 15–41)
Albumin: 4.2 g/dL (ref 3.5–5.0)
Alkaline Phosphatase: 71 U/L (ref 38–126)
Anion gap: 6 (ref 5–15)
BUN: 16 mg/dL (ref 6–20)
CO2: 25 mmol/L (ref 22–32)
CREATININE: 0.97 mg/dL (ref 0.61–1.24)
Calcium: 8.8 mg/dL — ABNORMAL LOW (ref 8.9–10.3)
Chloride: 105 mmol/L (ref 101–111)
GFR calc Af Amer: >60 mL/min (ref >60)
Glucose, Bld: 104 mg/dL — ABNORMAL HIGH (ref 65–99)
POTASSIUM: 3.9 mmol/L (ref 3.5–5.1)
SODIUM: 136 mmol/L (ref 135–145)
TOTAL PROTEIN: 7.2 g/dL (ref 6.5–8.1)
Total Bilirubin: 1.2 mg/dL (ref 0.3–1.2)

## 2017-01-28 LAB — LIPID PANEL
Cholesterol: 170 mg/dL (ref 0–200)
HDL: 48 mg/dL (ref >40)
LDL CALC: 99 mg/dL (ref 0–99)
Total CHOL/HDL Ratio: 3.5 RATIO
Triglycerides: 115 mg/dL (ref <150)
VLDL: 23 mg/dL (ref 0–40)

## 2017-02-02 ENCOUNTER — Ambulatory Visit: Payer: Self-pay | Admitting: Physician Assistant

## 2017-02-02 ENCOUNTER — Encounter: Payer: Self-pay | Admitting: Physician Assistant

## 2017-02-02 VITALS — BP 110/70 | HR 49 | Temp 97.9°F | Ht 63.5 in | Wt 154.2 lb

## 2017-02-02 DIAGNOSIS — E785 Hyperlipidemia, unspecified: Secondary | ICD-10-CM

## 2017-02-02 NOTE — Progress Notes (Signed)
BP 110/70 (BP Location: Left Arm, Patient Position: Sitting, Cuff Size: Normal)   Pulse (!) 49   Temp 97.9 F (36.6 C)   Ht 5' 3.5" (1.613 m)   Wt 154 lb 4 oz (70 kg)   SpO2 98%   BMI 26.90 kg/m    Subjective:    Patient ID: Bobby Newton, Bobby Newton    DOB: 10-Mar-1966, 51 y.o.   MRN: [id number]  HPI: Bobby Newton is a 51 y.o. Bobby Newton presenting on 02/02/2017 for Hyperlipidemia (pt states taking chol med but unsure which one)   HPI   Pt says he is doing well.  he says he is taking the cholesterol medication from the pharmacy (the Rx one).    Relevant past medical, surgical, family and social history reviewed and updated as indicated. Interim medical history since our last visit reviewed. Allergies and medications reviewed and updated.   Current Outpatient Prescriptions:  .  atorvastatin (LIPITOR) 20 MG tablet, Take 1 tablet (20 mg total) by mouth daily. Tome una tableta por boca al dormir, Disp: 90 tablet, Rfl: 2 .  Omega-3 Fatty Acids (FISH OIL PO), Take 1 capsule by mouth 2 (two) times daily. , Disp: , Rfl:    Review of Systems  Constitutional: Negative for appetite change, chills, diaphoresis, fatigue, fever and unexpected weight change.  HENT: Positive for dental problem. Negative for congestion, drooling, ear pain, facial swelling, hearing loss, mouth sores, sneezing, sore throat, trouble swallowing and voice change.   Eyes: Positive for redness. Negative for pain, discharge, itching and visual disturbance.  Respiratory: Negative for cough, choking, shortness of breath and wheezing.   Cardiovascular: Negative for chest pain, palpitations and leg swelling.  Gastrointestinal: Positive for constipation. Negative for abdominal pain, blood in stool, diarrhea and vomiting.  Endocrine: Negative for cold intolerance, heat intolerance and polydipsia.  Genitourinary: Negative for decreased urine volume, dysuria and hematuria.  Musculoskeletal: Negative for arthralgias, back pain and  gait problem.  Skin: Negative for rash.  Allergic/Immunologic: Negative for environmental allergies.  Neurological: Negative for seizures, syncope, light-headedness and headaches.  Hematological: Negative for adenopathy.  Psychiatric/Behavioral: Negative for agitation, dysphoric mood and suicidal ideas. The patient is nervous/anxious.     Per HPI unless specifically indicated above     Objective:    BP 110/70 (BP Location: Left Arm, Patient Position: Sitting, Cuff Size: Normal)   Pulse (!) 49   Temp 97.9 F (36.6 C)   Ht 5' 3.5" (1.613 m)   Wt 154 lb 4 oz (70 kg)   SpO2 98%   BMI 26.90 kg/m   Wt Readings from Last 3 Encounters:  02/02/17 154 lb 4 oz (70 kg)  11/04/16 156 lb (70.8 kg)  08/04/16 159 lb 12 oz (72.5 kg)    Physical Exam  Constitutional: He is oriented to person, place, and time. He appears well-developed and well-nourished.  HENT:  Head: Normocephalic and atraumatic.  Neck: Neck supple.  Cardiovascular: Normal rate and regular rhythm.   Pulmonary/Chest: Effort normal and breath sounds normal. He has no wheezes.  Abdominal: Soft. Bowel sounds are normal. There is no hepatosplenomegaly. There is no tenderness.  Musculoskeletal: He exhibits no edema.  Lymphadenopathy:    He has no cervical adenopathy.  Neurological: He is alert and oriented to person, place, and time.  Skin: Skin is warm and dry.  Psychiatric: He has a normal mood and affect. His behavior is normal.  Vitals reviewed.   Results for orders placed or performed during the hospital encounter  of 01/28/17  Lipid panel  Result Value Ref Range   Cholesterol 170 0 - 200 mg/dL   Triglycerides 115 <150 mg/dL   HDL 48 >40 mg/dL   Total CHOL/HDL Ratio 3.5 RATIO   VLDL 23 0 - 40 mg/dL   LDL Cholesterol 99 0 - 99 mg/dL  Comprehensive metabolic panel  Result Value Ref Range   Sodium 136 135 - 145 mmol/L   Potassium 3.9 3.5 - 5.1 mmol/L   Chloride 105 101 - 111 mmol/L   CO2 25 22 - 32 mmol/L    Glucose, Bld 104 (H) 65 - 99 mg/dL   BUN 16 6 - 20 mg/dL   Creatinine, Ser 0.97 0.61 - 1.24 mg/dL   Calcium 8.8 (L) 8.9 - 10.3 mg/dL   Total Protein 7.2 6.5 - 8.1 g/dL   Albumin 4.2 3.5 - 5.0 g/dL   AST 37 15 - 41 U/L   ALT 37 17 - 63 U/L   Alkaline Phosphatase 71 38 - 126 U/L   Total Bilirubin 1.2 0.3 - 1.2 mg/dL   GFR calc non Af Amer >60 >60 mL/min   GFR calc Af Amer >60 >60 mL/min   Anion gap 6 5 - 15      Assessment & Plan:   Encounter Diagnosis  Name Primary?  . Hyperlipidemia, unspecified hyperlipidemia type Yes     -reviewed labs with pt -pt to continue medications for cholesterol -follow up in 6 months. RTO sooner prn

## 2017-02-02 NOTE — Patient Instructions (Signed)
Keep taking your cholesterol medication Siga tomando su medicina para el colesterol

## 2017-02-16 ENCOUNTER — Other Ambulatory Visit: Payer: Self-pay | Admitting: Physician Assistant

## 2017-04-02 ENCOUNTER — Other Ambulatory Visit: Payer: Self-pay | Admitting: Physician Assistant

## 2017-04-02 DIAGNOSIS — E785 Hyperlipidemia, unspecified: Secondary | ICD-10-CM

## 2017-04-02 DIAGNOSIS — Z125 Encounter for screening for malignant neoplasm of prostate: Secondary | ICD-10-CM

## 2017-06-29 ENCOUNTER — Ambulatory Visit: Payer: Self-pay | Admitting: Physician Assistant

## 2017-07-31 ENCOUNTER — Other Ambulatory Visit (HOSPITAL_COMMUNITY)
Admission: RE | Admit: 2017-07-31 | Discharge: 2017-07-31 | Disposition: A | Discharge status: Home / Self Care | Payer: Self-pay | Source: Ambulatory Visit | Attending: Physician Assistant | Admitting: Physician Assistant

## 2017-07-31 DIAGNOSIS — E785 Hyperlipidemia, unspecified: Secondary | ICD-10-CM | POA: Insufficient documentation

## 2017-07-31 DIAGNOSIS — Z125 Encounter for screening for malignant neoplasm of prostate: Secondary | ICD-10-CM | POA: Insufficient documentation

## 2017-07-31 LAB — LIPID PANEL
CHOL/HDL RATIO: 3.4 ratio
Cholesterol: 157 mg/dL (ref 0–200)
HDL: 46 mg/dL (ref >40)
LDL CALC: 82 mg/dL (ref 0–99)
Triglycerides: 144 mg/dL (ref <150)
VLDL: 29 mg/dL (ref 0–40)

## 2017-07-31 LAB — COMPREHENSIVE METABOLIC PANEL
ALBUMIN: 4.2 g/dL (ref 3.5–5.0)
ALT: 31 U/L (ref 17–63)
AST: 29 U/L (ref 15–41)
Alkaline Phosphatase: 76 U/L (ref 38–126)
Anion gap: 8 (ref 5–15)
BUN: 13 mg/dL (ref 6–20)
CHLORIDE: 105 mmol/L (ref 101–111)
CO2: 26 mmol/L (ref 22–32)
Calcium: 8.8 mg/dL — ABNORMAL LOW (ref 8.9–10.3)
Creatinine, Ser: 0.88 mg/dL (ref 0.61–1.24)
GFR calc Af Amer: >60 mL/min (ref >60)
GFR calc non Af Amer: >60 mL/min (ref >60)
GLUCOSE: 105 mg/dL — AB (ref 65–99)
POTASSIUM: 4.1 mmol/L (ref 3.5–5.1)
Sodium: 139 mmol/L (ref 135–145)
Total Bilirubin: 1 mg/dL (ref 0.3–1.2)
Total Protein: 7.1 g/dL (ref 6.5–8.1)

## 2017-07-31 LAB — PSA: PROSTATIC SPECIFIC ANTIGEN: 0.62 ng/mL (ref 0.00–4.00)

## 2017-08-05 ENCOUNTER — Encounter: Payer: Self-pay | Admitting: Physician Assistant

## 2017-08-05 ENCOUNTER — Ambulatory Visit: Payer: Self-pay | Admitting: Physician Assistant

## 2017-08-05 VITALS — BP 114/70 | HR 52 | Temp 97.9°F | Ht 63.5 in | Wt 154.0 lb

## 2017-08-05 DIAGNOSIS — E785 Hyperlipidemia, unspecified: Secondary | ICD-10-CM

## 2017-08-05 NOTE — Progress Notes (Signed)
BP 114/70 (BP Location: Left Arm, Patient Position: Sitting, Cuff Size: Normal)   Pulse (!) 52   Temp 97.9 F (36.6 C) (Other (Comment))   Ht 5' 3.5" (1.613 m)   Wt 154 lb (69.9 kg)   SpO2 98%   BMI 26.85 kg/m    Subjective:    Patient ID: Bobby Newton, male    DOB: [birth year], 51 y.o.   MRN: [id number]  HPI: Bobby Newton is a 51 y.o. male presenting on 08/05/2017 for Hyperlipidemia   HPI   Pt is doing well and has no complaints  Relevant past medical, surgical, family and social history reviewed and updated as indicated. Interim medical history since our last visit reviewed. Allergies and medications reviewed and updated.   Current Outpatient Medications:  .  atorvastatin (LIPITOR) 20 MG tablet, TOME UNA TABLETA POR VIA ORAL TODOS LOS DIAS, Disp: 90 tablet, Rfl: 2 .  Omega-3 Fatty Acids (FISH OIL PO), Take 1 capsule by mouth 2 (two) times daily. , Disp: , Rfl:    Review of Systems  Constitutional: Negative for appetite change, chills, diaphoresis, fatigue, fever and unexpected weight change.  HENT: Positive for dental problem. Negative for congestion, drooling, ear pain, facial swelling, hearing loss, mouth sores, sneezing, sore throat, trouble swallowing and voice change.   Eyes: Negative for pain, discharge, redness, itching and visual disturbance.  Respiratory: Negative for cough, choking, shortness of breath and wheezing.   Cardiovascular: Negative for chest pain, palpitations and leg swelling.  Gastrointestinal: Negative for abdominal pain, blood in stool, constipation, diarrhea and vomiting.  Endocrine: Negative for cold intolerance, heat intolerance and polydipsia.  Genitourinary: Negative for decreased urine volume, dysuria and hematuria.  Musculoskeletal: Negative for arthralgias, back pain and gait problem.  Skin: Negative for rash.  Allergic/Immunologic: Negative for environmental allergies.  Neurological: Negative for seizures, syncope, light-headedness  and headaches.  Hematological: Negative for adenopathy.  Psychiatric/Behavioral: Negative for agitation, dysphoric mood and suicidal ideas. The patient is not nervous/anxious.     Per HPI unless specifically indicated above     Objective:    BP 114/70 (BP Location: Left Arm, Patient Position: Sitting, Cuff Size: Normal)   Pulse (!) 52   Temp 97.9 F (36.6 C) (Other (Comment))   Ht 5' 3.5" (1.613 m)   Wt 154 lb (69.9 kg)   SpO2 98%   BMI 26.85 kg/m   Wt Readings from Last 3 Encounters:  08/05/17 154 lb (69.9 kg)  02/02/17 154 lb 4 oz (70 kg)  11/04/16 156 lb (70.8 kg)    Physical Exam  Constitutional: He is oriented to person, place, and time. He appears well-developed and well-nourished.  HENT:  Head: Normocephalic and atraumatic.  Neck: Neck supple.  Cardiovascular: Normal rate and regular rhythm.  Pulmonary/Chest: Effort normal and breath sounds normal. He has no wheezes.  Abdominal: Soft. Bowel sounds are normal. There is no hepatosplenomegaly. There is no tenderness.  Musculoskeletal: He exhibits no edema.  Lymphadenopathy:    He has no cervical adenopathy.  Neurological: He is alert and oriented to person, place, and time.  Skin: Skin is warm and dry.  Psychiatric: He has a normal mood and affect. His behavior is normal.  Vitals reviewed.   Results for orders placed or performed during the hospital encounter of 07/31/17  Comprehensive metabolic panel  Result Value Ref Range   Sodium 139 135 - 145 mmol/L   Potassium 4.1 3.5 - 5.1 mmol/L   Chloride 105 101 - 111 mmol/L  CO2 26 22 - 32 mmol/L   Glucose, Bld 105 (H) 65 - 99 mg/dL   BUN 13 6 - 20 mg/dL   Creatinine, Ser 0.88 0.61 - 1.24 mg/dL   Calcium 8.8 (L) 8.9 - 10.3 mg/dL   Total Protein 7.1 6.5 - 8.1 g/dL   Albumin 4.2 3.5 - 5.0 g/dL   AST 29 15 - 41 U/L   ALT 31 17 - 63 U/L   Alkaline Phosphatase 76 38 - 126 U/L   Total Bilirubin 1.0 0.3 - 1.2 mg/dL   GFR calc non Af Amer >60 >60 mL/min   GFR calc Af  Amer >60 >60 mL/min   Anion gap 8 5 - 15  Lipid panel  Result Value Ref Range   Cholesterol 157 0 - 200 mg/dL   Triglycerides 144 <150 mg/dL   HDL 46 >40 mg/dL   Total CHOL/HDL Ratio 3.4 RATIO   VLDL 29 0 - 40 mg/dL   LDL Cholesterol 82 0 - 99 mg/dL  PSA  Result Value Ref Range   Prostatic Specific Antigen 0.62 0.00 - 4.00 ng/mL      Assessment & Plan:   Encounter Diagnosis  Name Primary?  . Hyperlipidemia, unspecified hyperlipidemia type Yes     -reviewed labs with pt -pt is up-to-date with colonoscopy in 2015 -pt to continue his atorvastatin and fish oil -he will follow up in 6 months. RTO sooner prn

## 2017-12-14 ENCOUNTER — Encounter: Payer: Self-pay | Admitting: Physician Assistant

## 2017-12-14 ENCOUNTER — Ambulatory Visit: Payer: Self-pay | Admitting: Physician Assistant

## 2017-12-14 VITALS — BP 123/75 | HR 57 | Temp 97.9°F | Ht 63.5 in | Wt 157.5 lb

## 2017-12-14 DIAGNOSIS — T148XXA Other injury of unspecified body region, initial encounter: Principal | ICD-10-CM

## 2017-12-14 DIAGNOSIS — L089 Local infection of the skin and subcutaneous tissue, unspecified: Secondary | ICD-10-CM

## 2017-12-14 MED ORDER — CEPHALEXIN 500 MG PO CAPS: ORAL_CAPSULE | ORAL | 0 refills | Status: DC

## 2017-12-14 NOTE — Progress Notes (Signed)
BP 123/75 (BP Location: Right Arm, Patient Position: Sitting, Cuff Size: Normal)   Pulse (!) 57   Temp 97.9 F (36.6 C)   Ht 5' 3.5" (1.613 m)   Wt 157 lb 8 oz (71.4 kg)   SpO2 99%   BMI 27.46 kg/m    Subjective:    Patient ID: Bobby Newton, male    DOB: [birth year], 52 y.o.   MRN: [id number]  HPI: Bobby Newton is a 52 y.o. male presenting on 12/14/2017 for Laceration (on left hand done by a rooster. pt statas it happened on Friday. pt states he has apply noesporin pt states he thinks that helped the cut close. pt states he his hand feels hot, and swollen pt thinks he may be due to a tetnus shot)   HPI   Chief Complaint  Patient presents with  . Laceration    on left hand done by a rooster. pt statas it happened on Friday. pt states he has apply noesporin pt states he thinks that helped the cut close. pt states he his hand feels hot, and swollen pt thinks he may be due to a tetnus shot    Pt states it was the bird's claw that poked his hand.  It was not the beak.   Pt denies fever.   Relevant past medical, surgical, family and social history reviewed and updated as indicated. Interim medical history since our last visit reviewed. Allergies and medications reviewed and updated.  Review of Systems  Constitutional: Negative for appetite change, chills, diaphoresis, fatigue, fever and unexpected weight change.  HENT: Negative for congestion, dental problem, drooling, ear pain, facial swelling, hearing loss, mouth sores, sneezing, sore throat, trouble swallowing and voice change.   Eyes: Negative for pain, discharge, redness, itching and visual disturbance.  Respiratory: Negative for cough, choking, shortness of breath and wheezing.   Cardiovascular: Negative for chest pain, palpitations and leg swelling.  Gastrointestinal: Negative for abdominal pain, blood in stool, constipation, diarrhea and vomiting.  Endocrine: Negative for cold intolerance, heat intolerance and  polydipsia.  Genitourinary: Negative for decreased urine volume, dysuria and hematuria.  Musculoskeletal: Negative for arthralgias, back pain and gait problem.  Skin: Negative for rash.  Allergic/Immunologic: Negative for environmental allergies.  Neurological: Negative for seizures, syncope, light-headedness and headaches.  Hematological: Negative for adenopathy.  Psychiatric/Behavioral: Negative for agitation, dysphoric mood and suicidal ideas. The patient is not nervous/anxious.     Per HPI unless specifically indicated above     Objective:    BP 123/75 (BP Location: Right Arm, Patient Position: Sitting, Cuff Size: Normal)   Pulse (!) 57   Temp 97.9 F (36.6 C)   Ht 5' 3.5" (1.613 m)   Wt 157 lb 8 oz (71.4 kg)   SpO2 99%   BMI 27.46 kg/m   Wt Readings from Last 3 Encounters:  12/14/17 157 lb 8 oz (71.4 kg)  08/05/17 154 lb (69.9 kg)  02/02/17 154 lb 4 oz (70 kg)    Physical Exam  Constitutional: He is oriented to person, place, and time. He appears well-developed and well-nourished.  HENT:  Head: Normocephalic and atraumatic.  Pulmonary/Chest: Effort normal.  Musculoskeletal:  One one on dorsal surface and another on volar surface.  Both near the thumb.  There is soft tissue swelling and mild redness.  FROM.  No drainage.   Neurological: He is alert and oriented to person, place, and time.  Skin: Skin is warm and dry.  Psychiatric: He has a normal mood and  affect. His behavior is normal.  Nursing note and vitals reviewed.       Assessment & Plan:    Encounter Diagnosis  Name Primary?  . Infected wound Yes     -rx keflex -gave pt rx to go to [facility] and get Td shot updated today -pt to RTO if hand worsens or fails to resolve.  Otherwise, follow up as scheduled

## 2017-12-14 NOTE — Patient Instructions (Signed)
Infeccin en las heridas (Wound Infection) La infeccin ocurre cuando algn tipo de germen se desarrolla en una herida. Las bacterias causan la mayora de las infecciones en las heridas. Tambin pueden ocurrir otro tipo de infecciones. En algunos casos, la infeccin hace que la herida se abra. Las infecciones en las heridas necesitan tratamiento. Si una infeccin en una herida no es tratada, puede haber complicaciones. CUIDADOS EN EL HOGAR Medicamentos  Tome o aplique los medicamentos de venta libre y los recetados solamente como se lo haya indicado el mdico.  Si le recetaron un antibitico, tmelo o aplqueselo como se lo haya indicado el mdico. No deje de usar el antibitico aunque la afeccin mejore. Cuidados de la herida  Limpie la herida una vez al da o como se lo haya indicado el mdico. ? Lave la herida con agua y [place]. ? Enjuguela con agua para quitar todo el jabn. ? Seque dando palmaditas con una toalla limpia y seca. No la frote.  Siga las indicaciones del mdico en lo que respecta al cuidado de la herida. Haga lo siguiente: ? Lvese las manos con agua y jabn antes de Quarry manager las vendas (vendaje). Use un desinfectante para manos si no dispone de [place]. ? Cambie el vendaje como se lo haya indicado el mdico. ? Deje los puntos (suturas), pegamento o tiras para adherir a la piel Optometrist) en su lugar si la herida se ha cerrado. Tal vez deban dejarse puestos en la piel durante 2semanas o ms tiempo. Si las tiras [place] se despegan y se enroscan, puede recortar los bordes sueltos. No retire las tiras [facility] por completo a menos que el mdico lo autorice. Algunas heridas se dejan abiertas para que se curen por s solas.  Controle la herida [employer] para detectar signos de infeccin. Est atento a lo siguiente: ? Aumento del enrojecimiento, la hinchazn o Conservation officer, historic buildings. ? Ms lquido Delorise Shiner. ? Calor. ? Pus o mal olor. Instrucciones generales  Mantenga  seco el vendaje hasta que el mdico le diga que se lo puede quitar.  No tome baos de inmersin, no nade, no use el jacuzzi ni haga ninguna actividad en la que la herida quede debajo del agua hasta que el mdico lo autorice.  Cuando est sentado o acostado, eleve la zona de la lesin por encima del nivel del corazn.  No se rasque ni se toque la herida.  Concurra a todas las visitas de control como se lo haya indicado el mdico. Esto es importante. SOLICITE AYUDA SI:  Los medicamentos no Forensic psychologist.  Tiene ms enrojecimiento, hinchazn o dolor en el lugar de la herida.  Observa ms lquido o sangre que salen de la herida.  La herida est caliente al tacto.  Observa pus que proviene de la herida.  Percibe que sale mal olor de la herida o del vendaje.  La herida estaba cerrada y se abre. SOLICITE AYUDA DE INMEDIATO SI:  Tiene una lnea roja que sale de la herida.  Tiene fiebre. Esta informacin no tiene Marine scientist el consejo del mdico. Asegrese de hacerle al mdico cualquier pregunta que tenga. Document Released: 05/15/2011 Document Revised: 01/07/2016 Document Reviewed: 03/05/2015 Elsevier Interactive Patient Education  2018 [employer].

## 2017-12-21 ENCOUNTER — Encounter: Payer: Self-pay | Admitting: Physician Assistant

## 2017-12-21 ENCOUNTER — Ambulatory Visit: Payer: Self-pay | Admitting: Physician Assistant

## 2017-12-21 VITALS — BP 122/71 | HR 55 | Temp 97.7°F | Ht 63.5 in | Wt 159.0 lb

## 2017-12-21 DIAGNOSIS — Z5189 Encounter for other specified aftercare: Secondary | ICD-10-CM

## 2017-12-21 NOTE — Progress Notes (Signed)
BP 122/71 (BP Location: Right Arm, Patient Position: Sitting, Cuff Size: Normal)   Pulse (!) 55   Temp 97.7 F (36.5 C)   Ht 5' 3.5" (1.613 m)   Wt 159 lb (72.1 kg)   SpO2 98%   BMI 27.72 kg/m    Subjective:    Patient ID: Bobby Newton, male    DOB: 1966-05-01, 52 y.o.   MRN: [id number]  HPI: Bobby Newton is a 52 y.o. male presenting on 12/21/2017 for Wound Check   HPI   Pt says his hand is improved.  He says today he will finish his antibiotics.  He did go to [facility] and get a tetanus update as recommended   Relevant past medical, surgical, family and social history reviewed and updated as indicated. Interim medical history since our last visit reviewed. Allergies and medications reviewed and updated.   Current Outpatient Medications:  .  atorvastatin (LIPITOR) 20 MG tablet, TOME UNA TABLETA POR VIA ORAL TODOS LOS DIAS, Disp: 90 tablet, Rfl: 2 .  cephALEXin (KEFLEX) 500 MG capsule, 1 po qid x 7 d.  Tome una tableta por boca cuatro veces diarias por 7 dias, Disp: 28 capsule, Rfl: 0 .  Omega-3 Fatty Acids (FISH OIL PO), Take 1 capsule by mouth daily. , Disp: , Rfl:    Review of Systems  Constitutional: Negative for appetite change, chills, diaphoresis, fatigue, fever and unexpected weight change.  HENT: Negative for congestion, dental problem, drooling, ear pain, facial swelling, hearing loss, mouth sores, sneezing, sore throat, trouble swallowing and voice change.   Eyes: Negative for pain, discharge, redness, itching and visual disturbance.  Respiratory: Negative for cough, choking, shortness of breath and wheezing.   Cardiovascular: Negative for chest pain, palpitations and leg swelling.  Gastrointestinal: Negative for abdominal pain, blood in stool, constipation, diarrhea and vomiting.  Endocrine: Negative for cold intolerance, heat intolerance and polydipsia.  Genitourinary: Negative for decreased urine volume, dysuria and hematuria.  Musculoskeletal: Negative for  arthralgias, back pain and gait problem.  Skin: Negative for rash.  Allergic/Immunologic: Negative for environmental allergies.  Neurological: Negative for seizures, syncope, light-headedness and headaches.  Hematological: Negative for adenopathy.  Psychiatric/Behavioral: Negative for agitation, dysphoric mood and suicidal ideas. The patient is not nervous/anxious.     Per HPI unless specifically indicated above     Objective:    BP 122/71 (BP Location: Right Arm, Patient Position: Sitting, Cuff Size: Normal)   Pulse (!) 55   Temp 97.7 F (36.5 C)   Ht 5' 3.5" (1.613 m)   Wt 159 lb (72.1 kg)   SpO2 98%   BMI 27.72 kg/m   Wt Readings from Last 3 Encounters:  12/21/17 159 lb (72.1 kg)  12/14/17 157 lb 8 oz (71.4 kg)  08/05/17 154 lb (69.9 kg)    Physical Exam  Constitutional: He is oriented to person, place, and time. He appears well-developed and well-nourished.  HENT:  Head: Normocephalic and atraumatic.  Pulmonary/Chest: Effort normal.  Musculoskeletal:       Left hand: Normal. He exhibits normal range of motion, no tenderness, normal capillary refill and no swelling.  Swelling and redness L hand resolved  Neurological: He is alert and oriented to person, place, and time.  Skin: Skin is warm and dry.  Psychiatric: He has a normal mood and affect. His behavior is normal.  Nursing note and vitals reviewed.        Assessment & Plan:   Encounter Diagnosis  Name Primary?  . Encounter for  wound re-check Yes     -pt to finish antibiotics -follow up May as scheduled.  RTO sooner prn

## 2018-01-29 ENCOUNTER — Other Ambulatory Visit (HOSPITAL_COMMUNITY)
Admission: RE | Admit: 2018-01-29 | Discharge: 2018-01-29 | Disposition: A | Discharge status: Home / Self Care | Payer: Self-pay | Source: Ambulatory Visit | Attending: Physician Assistant | Admitting: Physician Assistant

## 2018-01-29 DIAGNOSIS — E785 Hyperlipidemia, unspecified: Secondary | ICD-10-CM | POA: Insufficient documentation

## 2018-01-29 LAB — LIPID PANEL
CHOL/HDL RATIO: 2.8 ratio
CHOLESTEROL: 132 mg/dL (ref 0–200)
HDL: 47 mg/dL (ref >40)
LDL Cholesterol: 68 mg/dL (ref 0–99)
Triglycerides: 87 mg/dL (ref <150)
VLDL: 17 mg/dL (ref 0–40)

## 2018-01-29 LAB — COMPREHENSIVE METABOLIC PANEL
ALBUMIN: 4.1 g/dL (ref 3.5–5.0)
ALT: 31 U/L (ref 17–63)
AST: 30 U/L (ref 15–41)
Alkaline Phosphatase: 76 U/L (ref 38–126)
Anion gap: 10 (ref 5–15)
BILIRUBIN TOTAL: 0.9 mg/dL (ref 0.3–1.2)
BUN: 18 mg/dL (ref 6–20)
CO2: 23 mmol/L (ref 22–32)
Calcium: 8.5 mg/dL — ABNORMAL LOW (ref 8.9–10.3)
Chloride: 104 mmol/L (ref 101–111)
Creatinine, Ser: 0.92 mg/dL (ref 0.61–1.24)
GFR calc Af Amer: >60 mL/min (ref >60)
Glucose, Bld: 110 mg/dL — ABNORMAL HIGH (ref 65–99)
POTASSIUM: 3.7 mmol/L (ref 3.5–5.1)
Sodium: 137 mmol/L (ref 135–145)
TOTAL PROTEIN: 7.2 g/dL (ref 6.5–8.1)

## 2018-02-02 ENCOUNTER — Encounter: Payer: Self-pay | Admitting: Physician Assistant

## 2018-02-02 ENCOUNTER — Ambulatory Visit: Payer: Self-pay | Admitting: Physician Assistant

## 2018-02-02 VITALS — BP 108/76 | HR 55 | Temp 97.5°F | Ht 63.5 in | Wt 151.0 lb

## 2018-02-02 DIAGNOSIS — E785 Hyperlipidemia, unspecified: Secondary | ICD-10-CM

## 2018-02-02 DIAGNOSIS — Z125 Encounter for screening for malignant neoplasm of prostate: Secondary | ICD-10-CM

## 2018-02-02 MED ORDER — ATORVASTATIN CALCIUM 20 MG PO TABS: ORAL_TABLET | ORAL | 7 refills | Status: DC

## 2018-02-02 NOTE — Progress Notes (Signed)
BP 108/76 (BP Location: Left Arm, Patient Position: Sitting, Cuff Size: Normal)   Pulse (!) 55   Temp (!) 97.5 F (36.4 C) (Other (Comment))   Ht 5' 3.5" (1.613 m)   Wt 151 lb (68.5 kg)   SpO2 98%   BMI 26.33 kg/m    Subjective:    Patient ID: Bobby Newton, male    DOB: 1966/08/02, 52 y.o.   MRN: [id number]  HPI: Thanos Cousineau is a 52 y.o. male presenting on 02/02/2018 for Hyperlipidemia   HPI Pt is doing well and has no complaints.  Relevant past medical, surgical, family and social history reviewed and updated as indicated. Interim medical history since our last visit reviewed. Allergies and medications reviewed and updated.   Current Outpatient Medications:  .  atorvastatin (LIPITOR) 20 MG tablet, TOME UNA TABLETA POR VIA ORAL TODOS LOS DIAS, Disp: 90 tablet, Rfl: 2 .  Omega-3 Fatty Acids (FISH OIL PO), Take 1 capsule by mouth daily. , Disp: , Rfl:   Review of Systems  Constitutional: Negative for appetite change, chills, diaphoresis, fatigue, fever and unexpected weight change.  HENT: Negative for congestion, dental problem, drooling, ear pain, facial swelling, hearing loss, mouth sores, sneezing, sore throat, trouble swallowing and voice change.   Eyes: Negative for pain, discharge, redness, itching and visual disturbance.  Respiratory: Negative for cough, choking, shortness of breath and wheezing.   Cardiovascular: Negative for chest pain, palpitations and leg swelling.  Gastrointestinal: Negative for abdominal pain, blood in stool, constipation, diarrhea and vomiting.  Endocrine: Negative for cold intolerance, heat intolerance and polydipsia.  Genitourinary: Negative for decreased urine volume, dysuria and hematuria.  Musculoskeletal: Negative for arthralgias, back pain and gait problem.  Skin: Negative for rash.  Allergic/Immunologic: Negative for environmental allergies.  Neurological: Negative for seizures, syncope, light-headedness and headaches.   Hematological: Negative for adenopathy.  Psychiatric/Behavioral: Negative for agitation, dysphoric mood and suicidal ideas. The patient is not nervous/anxious.     Per HPI unless specifically indicated above     Objective:    BP 108/76 (BP Location: Left Arm, Patient Position: Sitting, Cuff Size: Normal)   Pulse (!) 55   Temp (!) 97.5 F (36.4 C) (Other (Comment))   Ht 5' 3.5" (1.613 m)   Wt 151 lb (68.5 kg)   SpO2 98%   BMI 26.33 kg/m   Wt Readings from Last 3 Encounters:  02/02/18 151 lb (68.5 kg)  12/21/17 159 lb (72.1 kg)  12/14/17 157 lb 8 oz (71.4 kg)    Physical Exam  Constitutional: He is oriented to person, place, and time. He appears well-developed and well-nourished.  HENT:  Head: Normocephalic and atraumatic.  Neck: Neck supple.  Cardiovascular: Normal rate and regular rhythm.  Pulmonary/Chest: Effort normal and breath sounds normal. He has no wheezes.  Abdominal: Soft. Bowel sounds are normal. There is no hepatosplenomegaly. There is no tenderness.  Musculoskeletal: He exhibits no edema.  Lymphadenopathy:    He has no cervical adenopathy.  Neurological: He is alert and oriented to person, place, and time.  Skin: Skin is warm and dry.  Psychiatric: He has a normal mood and affect. His behavior is normal.  Vitals reviewed.   Results for orders placed or performed during the hospital encounter of 01/29/18  Comprehensive metabolic panel  Result Value Ref Range   Sodium 137 135 - 145 mmol/L   Potassium 3.7 3.5 - 5.1 mmol/L   Chloride 104 101 - 111 mmol/L   CO2 23 22 - 32 mmol/L  Glucose, Bld 110 (H) 65 - 99 mg/dL   BUN 18 6 - 20 mg/dL   Creatinine, Ser 0.92 0.61 - 1.24 mg/dL   Calcium 8.5 (L) 8.9 - 10.3 mg/dL   Total Protein 7.2 6.5 - 8.1 g/dL   Albumin 4.1 3.5 - 5.0 g/dL   AST 30 15 - 41 U/L   ALT 31 17 - 63 U/L   Alkaline Phosphatase 76 38 - 126 U/L   Total Bilirubin 0.9 0.3 - 1.2 mg/dL   GFR calc non Af Amer >60 >60 mL/min   GFR calc Af Amer >60  >60 mL/min   Anion gap 10 5 - 15  Lipid panel  Result Value Ref Range   Cholesterol 132 0 - 200 mg/dL   Triglycerides 87 <150 mg/dL   HDL 47 >40 mg/dL   Total CHOL/HDL Ratio 2.8 RATIO   VLDL 17 0 - 40 mg/dL   LDL Cholesterol 68 0 - 99 mg/dL      Assessment & Plan:   Encounter Diagnoses  Name Primary?  . Hyperlipidemia, unspecified hyperlipidemia type Yes  . Screening for prostate cancer     -reviewed labs with pt -pt to Continue current medications and lowfat diet -pt to Follow up 6 months.  RTO sooner prn

## 2018-02-11 ENCOUNTER — Other Ambulatory Visit: Payer: Self-pay | Admitting: Physician Assistant

## 2018-07-28 ENCOUNTER — Other Ambulatory Visit (HOSPITAL_COMMUNITY)
Admission: RE | Admit: 2018-07-28 | Discharge: 2018-07-28 | Disposition: A | Discharge status: Home / Self Care | Payer: Self-pay | Source: Ambulatory Visit | Attending: Physician Assistant | Admitting: Physician Assistant

## 2018-07-28 DIAGNOSIS — Z125 Encounter for screening for malignant neoplasm of prostate: Secondary | ICD-10-CM | POA: Insufficient documentation

## 2018-07-28 DIAGNOSIS — E785 Hyperlipidemia, unspecified: Secondary | ICD-10-CM | POA: Insufficient documentation

## 2018-07-28 LAB — COMPREHENSIVE METABOLIC PANEL
ALK PHOS: 64 U/L (ref 38–126)
ALT: 30 U/L (ref 0–44)
AST: 31 U/L (ref 15–41)
Albumin: 4.3 g/dL (ref 3.5–5.0)
Anion gap: 8 (ref 5–15)
BILIRUBIN TOTAL: 1.2 mg/dL (ref 0.3–1.2)
BUN: 14 mg/dL (ref 6–20)
CALCIUM: 8.8 mg/dL — AB (ref 8.9–10.3)
CO2: 25 mmol/L (ref 22–32)
CREATININE: 0.97 mg/dL (ref 0.61–1.24)
Chloride: 105 mmol/L (ref 98–111)
GFR calc non Af Amer: >60 mL/min (ref >60)
Glucose, Bld: 106 mg/dL — ABNORMAL HIGH (ref 70–99)
Potassium: 4.4 mmol/L (ref 3.5–5.1)
SODIUM: 138 mmol/L (ref 135–145)
TOTAL PROTEIN: 7.3 g/dL (ref 6.5–8.1)

## 2018-07-28 LAB — LIPID PANEL
Cholesterol: 147 mg/dL (ref 0–200)
HDL: 45 mg/dL (ref >40)
LDL Cholesterol: 80 mg/dL (ref 0–99)
Total CHOL/HDL Ratio: 3.3 RATIO
Triglycerides: 108 mg/dL (ref <150)
VLDL: 22 mg/dL (ref 0–40)

## 2018-07-28 LAB — PSA: Prostatic Specific Antigen: 0.58 ng/mL (ref 0.00–4.00)

## 2018-08-03 ENCOUNTER — Ambulatory Visit: Payer: Self-pay | Admitting: Physician Assistant

## 2018-08-03 ENCOUNTER — Encounter: Payer: Self-pay | Admitting: Physician Assistant

## 2018-08-03 VITALS — BP 110/68 | HR 55 | Temp 97.7°F | Ht 63.5 in | Wt 155.0 lb

## 2018-08-03 DIAGNOSIS — E785 Hyperlipidemia, unspecified: Secondary | ICD-10-CM

## 2018-08-03 MED ORDER — ATORVASTATIN CALCIUM 20 MG PO TABS: ORAL_TABLET | ORAL | 6 refills | Status: DC

## 2018-08-03 NOTE — Progress Notes (Signed)
BP 110/68 (BP Location: Left Arm, Patient Position: Sitting, Cuff Size: Normal)   Pulse (!) 55   Temp 97.7 F (36.5 C)   Ht 5' 3.5" (1.613 m)   Wt 155 lb (70.3 kg)   SpO2 98%   BMI 27.03 kg/m    Subjective:    Patient ID: Bobby Newton, male    DOB: April 13, 1966, 52 y.o.   MRN: [id number]  HPI: Bobby Newton is a 52 y.o. male presenting on 08/03/2018 for Hyperlipidemia   HPI   Pt is feeling well and has no complaints  Alden Server translating  Relevant past medical, surgical, family and social history reviewed and updated as indicated. Interim medical history since our last visit reviewed. Allergies and medications reviewed and updated.   Current Outpatient Medications:  .  atorvastatin (LIPITOR) 20 MG tablet, TOME UNA TABLETA POR VIA ORAL TODOS LOS DIAS, Disp: 30 tablet, Rfl: 6 .  Omega-3 Fatty Acids (FISH OIL PO), Take 2 capsules by mouth daily. , Disp: , Rfl:    Review of Systems  Constitutional: Negative for appetite change, chills, diaphoresis, fatigue, fever and unexpected weight change.  HENT: Positive for dental problem. Negative for congestion, drooling, ear pain, facial swelling, hearing loss, mouth sores, sneezing, sore throat, trouble swallowing and voice change.   Eyes: Negative for pain, discharge, redness, itching and visual disturbance.  Respiratory: Negative for cough, choking, shortness of breath and wheezing.   Cardiovascular: Negative for chest pain, palpitations and leg swelling.  Gastrointestinal: Positive for constipation. Negative for abdominal pain, blood in stool, diarrhea and vomiting.  Endocrine: Negative for cold intolerance, heat intolerance and polydipsia.  Genitourinary: Negative for decreased urine volume, dysuria and hematuria.  Musculoskeletal: Negative for arthralgias, back pain and gait problem.  Skin: Negative for rash.  Allergic/Immunologic: Negative for environmental allergies.  Neurological: Negative for seizures, syncope,  light-headedness and headaches.  Hematological: Negative for adenopathy.  Psychiatric/Behavioral: Negative for agitation, dysphoric mood and suicidal ideas. The patient is nervous/anxious.     Per HPI unless specifically indicated above     Objective:    BP 110/68 (BP Location: Left Arm, Patient Position: Sitting, Cuff Size: Normal)   Pulse (!) 55   Temp 97.7 F (36.5 C)   Ht 5' 3.5" (1.613 m)   Wt 155 lb (70.3 kg)   SpO2 98%   BMI 27.03 kg/m   Wt Readings from Last 3 Encounters:  08/03/18 155 lb (70.3 kg)  02/02/18 151 lb (68.5 kg)  12/21/17 159 lb (72.1 kg)    Physical Exam  Constitutional: He is oriented to person, place, and time. He appears well-developed and well-nourished.  HENT:  Head: Normocephalic and atraumatic.  Neck: Neck supple.  Cardiovascular: Normal rate and regular rhythm.  Pulmonary/Chest: Effort normal and breath sounds normal. He has no wheezes.  Abdominal: Soft. Bowel sounds are normal. There is no hepatosplenomegaly. There is no tenderness.  Musculoskeletal: He exhibits no edema.  Lymphadenopathy:    He has no cervical adenopathy.  Neurological: He is alert and oriented to person, place, and time.  Skin: Skin is warm and dry.  Psychiatric: He has a normal mood and affect. His behavior is normal.  Vitals reviewed.     Results for orders placed or performed during the hospital encounter of 07/28/18  PSA  Result Value Ref Range   Prostatic Specific Antigen 0.58 0.00 - 4.00 ng/mL  Comprehensive metabolic panel  Result Value Ref Range   Sodium 138 135 - 145 mmol/L   Potassium  4.4 3.5 - 5.1 mmol/L   Chloride 105 98 - 111 mmol/L   CO2 25 22 - 32 mmol/L   Glucose, Bld 106 (H) 70 - 99 mg/dL   BUN 14 6 - 20 mg/dL   Creatinine, Ser 0.97 0.61 - 1.24 mg/dL   Calcium 8.8 (L) 8.9 - 10.3 mg/dL   Total Protein 7.3 6.5 - 8.1 g/dL   Albumin 4.3 3.5 - 5.0 g/dL   AST 31 15 - 41 U/L   ALT 30 0 - 44 U/L   Alkaline Phosphatase 64 38 - 126 U/L   Total  Bilirubin 1.2 0.3 - 1.2 mg/dL   GFR calc non Af Amer >60 >60 mL/min   GFR calc Af Amer >60 >60 mL/min   Anion gap 8 5 - 15  Lipid panel  Result Value Ref Range   Cholesterol 147 0 - 200 mg/dL   Triglycerides 108 <150 mg/dL   HDL 45 >40 mg/dL   Total CHOL/HDL Ratio 3.3 RATIO   VLDL 22 0 - 40 mg/dL   LDL Cholesterol 80 0 - 99 mg/dL      Assessment & Plan:   Encounter Diagnosis  Name Primary?  . Hyperlipidemia, unspecified hyperlipidemia type Yes    -reviewed labs with pt -pt to continue current medications and healthy lifestyle -pt to follow up 6 months.  RTO sooner prn (5/5)

## 2019-02-01 ENCOUNTER — Encounter: Payer: Self-pay | Admitting: Physician Assistant

## 2019-02-01 ENCOUNTER — Ambulatory Visit: Payer: Self-pay | Admitting: Physician Assistant

## 2019-02-01 DIAGNOSIS — E785 Hyperlipidemia, unspecified: Secondary | ICD-10-CM

## 2019-02-01 DIAGNOSIS — Z789 Other specified health status: Secondary | ICD-10-CM

## 2019-02-01 NOTE — Progress Notes (Signed)
   There were no vitals taken for this visit.   Subjective:    Patient ID: Bobby Newton, male    DOB: 1966-05-26, 53 y.o.   MRN: [id number]  HPI: Bobby Newton is a 53 y.o. male presenting on 02/01/2019 for No chief complaint on file.   HPI    This is a telemedicine visit due to coronavirus pandemic. It is via telephone as pt does not have a smartphone  I connected with  [name] on 02/01/19 by a video enabled telemedicine application and verified that I am speaking with the correct person using two identifiers.   I discussed the limitations of evaluation and management by telemedicine. The patient expressed understanding and agreed to proceed.   Pt is doing well and has no complaints today.  He is mostly staying home except when he goes to work or the grocery.  He wears a mask when he goes out.   Relevant past medical, surgical, family and social history reviewed and updated as indicated. Interim medical history since our last visit reviewed. Allergies and medications reviewed and updated.   Current Outpatient Medications:  .  atorvastatin (LIPITOR) 20 MG tablet, TOME UNA TABLETA POR VIA ORAL TODOS LOS DIAS, Disp: 30 tablet, Rfl: 6 .  Omega-3 Fatty Acids (FISH OIL PO), Take 2 capsules by mouth daily. , Disp: , Rfl:     Review of Systems  Per HPI unless specifically indicated above     Objective:    There were no vitals taken for this visit.  Wt Readings from Last 3 Encounters:  08/03/18 155 lb (70.3 kg)  02/02/18 151 lb (68.5 kg)  12/21/17 159 lb (72.1 kg)    Physical Exam Pulmonary:     Effort: Pulmonary effort is normal. No respiratory distress.  Neurological:     Mental Status: He is alert and oriented to person, place, and time.  Psychiatric:        Attention and Perception: Attention normal.        Mood and Affect: Mood normal.        Speech: Speech normal.        Behavior: Behavior is cooperative.         Assessment & Plan:   Encounter  Diagnoses  Name Primary?  . Hyperlipidemia, unspecified hyperlipidemia type Yes  . Not proficient in English language     -Defer labs at this time due to CV19 -pt to Continue current medications -will Follow up in 3 months

## 2019-04-20 ENCOUNTER — Other Ambulatory Visit: Payer: Self-pay | Admitting: Physician Assistant

## 2019-04-20 MED ORDER — ATORVASTATIN CALCIUM 20 MG PO TABS: ORAL_TABLET | ORAL | 6 refills | Status: DC

## 2019-04-27 ENCOUNTER — Other Ambulatory Visit (HOSPITAL_COMMUNITY)
Admission: RE | Admit: 2019-04-27 | Discharge: 2019-04-27 | Disposition: A | Discharge status: Home / Self Care | Payer: Self-pay | Source: Ambulatory Visit | Attending: Physician Assistant | Admitting: Physician Assistant

## 2019-04-27 DIAGNOSIS — E785 Hyperlipidemia, unspecified: Secondary | ICD-10-CM | POA: Insufficient documentation

## 2019-04-27 LAB — COMPREHENSIVE METABOLIC PANEL
ALT: 33 U/L (ref 0–44)
AST: 28 U/L (ref 15–41)
Albumin: 4.1 g/dL (ref 3.5–5.0)
Alkaline Phosphatase: 70 U/L (ref 38–126)
Anion gap: 7 (ref 5–15)
BUN: 27 mg/dL — ABNORMAL HIGH (ref 6–20)
CO2: 22 mmol/L (ref 22–32)
Calcium: 8.6 mg/dL — ABNORMAL LOW (ref 8.9–10.3)
Chloride: 108 mmol/L (ref 98–111)
Creatinine, Ser: 0.96 mg/dL (ref 0.61–1.24)
GFR calc Af Amer: >60 mL/min (ref >60)
GFR calc non Af Amer: >60 mL/min (ref >60)
Glucose, Bld: 123 mg/dL — ABNORMAL HIGH (ref 70–99)
Potassium: 4.8 mmol/L (ref 3.5–5.1)
Sodium: 137 mmol/L (ref 135–145)
Total Bilirubin: 0.9 mg/dL (ref 0.3–1.2)
Total Protein: 6.9 g/dL (ref 6.5–8.1)

## 2019-04-27 LAB — LIPID PANEL
Cholesterol: 176 mg/dL (ref 0–200)
HDL: 47 mg/dL (ref >40)
LDL Cholesterol: 104 mg/dL — ABNORMAL HIGH (ref 0–99)
Total CHOL/HDL Ratio: 3.7 RATIO
Triglycerides: 123 mg/dL (ref <150)
VLDL: 25 mg/dL (ref 0–40)

## 2019-05-03 ENCOUNTER — Encounter: Payer: Self-pay | Admitting: Physician Assistant

## 2019-05-03 ENCOUNTER — Ambulatory Visit: Payer: Self-pay | Admitting: Physician Assistant

## 2019-05-03 DIAGNOSIS — Z125 Encounter for screening for malignant neoplasm of prostate: Secondary | ICD-10-CM

## 2019-05-03 DIAGNOSIS — E785 Hyperlipidemia, unspecified: Secondary | ICD-10-CM

## 2019-05-03 DIAGNOSIS — Z789 Other specified health status: Secondary | ICD-10-CM

## 2019-05-03 DIAGNOSIS — K5900 Constipation, unspecified: Secondary | ICD-10-CM

## 2019-05-03 NOTE — Patient Instructions (Signed)
Estreimiento, en adultos Constipation, Adult Estreimiento significa que una persona defeca en una semana menos que lo normal, tiene dificultad para defecar, o las heces son secas, duras, o ms grandes que lo normal. El estreimiento podra estar provocado por una enfermedad preexistente. Puede empeorar con la edad si una persona toma ciertos medicamentos y no toma suficiente lquido. Siga estas indicaciones en su casa: Qu debe comer y beber   Consuma alimentos con alto contenido de [place], como frutas y verduras frescas, cereales integrales y frijoles.  Limite los alimentos ricos en grasas y con bajo contenido de fibra, o muy procesados, como las papas fritas, [place], [place], dulces y refrescos.  Beba suficiente lquido para Consulting civil engineer orina clara o de color amarillo plido. Instrucciones generales  Haga actividad fsica habitualmente o como se lo haya indicado el mdico.  Vaya al bao cuando sienta la necesidad de ir. No se aguante las ganas.  Tome los medicamentos de venta libre y los recetados solamente como se lo haya indicado el mdico. Estos incluyen los suplementos de [place].  Practique ejercicios de rehabilitacin del suelo plvico, como la respiracin profunda mientras relaja la parte inferior del abdomen y la relajacin del suelo plvico mientras defeca.  Controle su afeccin para Actuary cambio.  Concurra a todas las visitas de seguimiento como se lo haya indicado el mdico. Esto es importante. Comunquese con un mdico si:  Su dolor empeora.  Tiene fiebre.  No defeca despus de 4das.  Vomita.  No tiene hambre.  Pierde peso.  Tiene una hemorragia en el ano.  Las heces son delgadas como un lpiz. Solicite ayuda de inmediato si:  Tiene fiebre y los sntomas empeoran repentinamente.  Observa que se filtran heces o hay sangre en las heces.  Tiene el abdomen distendido.  Siente un dolor intenso en el abdomen.  Se siente mareado o se  desmaya. Esta informacin no tiene Marine scientist el consejo del mdico. Asegrese de hacerle al mdico cualquier pregunta que tenga. Document Released: 10/05/2007 Document Revised: 01/05/2017 Document Reviewed: 03/05/2016 Elsevier Patient Education  2020 [employer].

## 2019-05-03 NOTE — Progress Notes (Signed)
There were no vitals taken for this visit.   Subjective:    Patient ID: Bobby Newton, male    DOB: [birth year], 53 y.o.   MRN: [id number]  HPI: Bobby Newton is a 53 y.o. male presenting on 05/03/2019 for No chief complaint on file.   HPI   This is a telemedicine appointment through [employer] due to coronavirus pandemic  I connected with  Bobby Newton on 05/03/19 by a video enabled telemedicine application and verified that I am speaking with the correct person using two identifiers.   I discussed the limitations of evaluation and management by telemedicine. The patient expressed understanding and agreed to proceed.  Pt is at home.  Provider is at office   Pt [place] but used translator due to telemedicine to ensure pt fully understands appointment   Pt says he is Constipated sometimes otherwise doing well.  No abdominal pain. No blood PR.  Eating normally.    He says he is wearing a mask when he goes out in public    Relevant past medical, surgical, family and social history reviewed and updated as indicated. Interim medical history since our last visit reviewed. Allergies and medications reviewed and updated.   Current Outpatient Medications:  .  atorvastatin (LIPITOR) 20 MG tablet, TOME UNA TABLETA POR VIA ORAL TODOS LOS DIAS, Disp: 30 tablet, Rfl: 6 .  Omega-3 Fatty Acids (FISH OIL PO), Take 2 capsules by mouth daily. , Disp: , Rfl:     Review of Systems  Per HPI unless specifically indicated above     Objective:    There were no vitals taken for this visit.  Wt Readings from Last 3 Encounters:  08/03/18 155 lb (70.3 kg)  02/02/18 151 lb (68.5 kg)  12/21/17 159 lb (72.1 kg)    Physical Exam Constitutional:      General: He is not in acute distress.    Appearance: Normal appearance. He is not ill-appearing.  HENT:     Head: Normocephalic and atraumatic.  Pulmonary:     Effort: Pulmonary effort is normal. No respiratory distress.  Neurological:      Mental Status: He is alert and oriented to person, place, and time.  Psychiatric:        Attention and Perception: Attention normal.        Speech: Speech normal.        Behavior: Behavior is cooperative.     Results for orders placed or performed during the hospital encounter of 04/27/19  Lipid panel  Result Value Ref Range   Cholesterol 176 0 - 200 mg/dL   Triglycerides 123 <150 mg/dL   HDL 47 >40 mg/dL   Total CHOL/HDL Ratio 3.7 RATIO   VLDL 25 0 - 40 mg/dL   LDL Cholesterol 104 (H) 0 - 99 mg/dL  Comprehensive metabolic panel  Result Value Ref Range   Sodium 137 135 - 145 mmol/L   Potassium 4.8 3.5 - 5.1 mmol/L   Chloride 108 98 - 111 mmol/L   CO2 22 22 - 32 mmol/L   Glucose, Bld 123 (H) 70 - 99 mg/dL   BUN 27 (H) 6 - 20 mg/dL   Creatinine, Ser 0.96 0.61 - 1.24 mg/dL   Calcium 8.6 (L) 8.9 - 10.3 mg/dL   Total Protein 6.9 6.5 - 8.1 g/dL   Albumin 4.1 3.5 - 5.0 g/dL   AST 28 15 - 41 U/L   ALT 33 0 - 44 U/L   Alkaline Phosphatase 70 38 -  126 U/L   Total Bilirubin 0.9 0.3 - 1.2 mg/dL   GFR calc non Af Amer >60 >60 mL/min   GFR calc Af Amer >60 >60 mL/min   Anion gap 7 5 - 15      Assessment & Plan:    Encounter Diagnoses  Name Primary?  . Hyperlipidemia, unspecified hyperlipidemia type Yes  . Constipation, unspecified constipation type   . Not proficient in [place] language     -reviewed labs with pt -pt to Continue current meds -Counseled on constipation with recommendations to increase water and fiber intake.  Will mail reading information to pt as well -pt to Follow up 6 months.  He is to contact office sooner if constipation persists or for other problem

## 2019-06-01 ENCOUNTER — Encounter: Payer: Self-pay | Admitting: Gastroenterology

## 2019-07-18 ENCOUNTER — Ambulatory Visit (INDEPENDENT_AMBULATORY_CARE_PROVIDER_SITE_OTHER): Payer: Self-pay | Admitting: *Deleted

## 2019-07-18 ENCOUNTER — Other Ambulatory Visit: Payer: Self-pay

## 2019-07-18 DIAGNOSIS — Z86010 Personal history of colonic polyps: Secondary | ICD-10-CM

## 2019-07-18 MED ORDER — PEG 3350-KCL-NA BICARB-NACL 420 G PO SOLR: 4000.0000 mL | Freq: Once | ORAL | 0 refills | Status: AC

## 2019-07-18 NOTE — Progress Notes (Signed)
Gastroenterology Pre-Procedure Review  Request Date: 07/18/2019 Requesting Physician: 5 year recall, Last TCS 06/07/2014, tubular adenoma  PATIENT REVIEW QUESTIONS: The patient responded to the following health history questions as indicated:    1. Diabetes Melitis: no 2. Joint replacements in the past 12 months: no 3. Major health problems in the past 3 months: no 4. Has an artificial valve or MVP: no 5. Has a defibrillator: no 6. Has been advised in past to take antibiotics in advance of a procedure like teeth cleaning: no 7. Family history of colon cancer: no  8. Alcohol Use: no 9. Illicit drug Use: no 10. History of sleep apnea: no  11. History of coronary artery or other vascular stents placed within the last 12 months: no 12. History of any prior anesthesia complications: no 13. There is no height or weight on file to calculate BMI.ht: 5' 4 wt: 160 lbs    MEDICATIONS & ALLERGIES:    Patient reports the following regarding taking any blood thinners:   Plavix? no Aspirin? no Coumadin? no Brilinta? no Xarelto? no Eliquis? no Pradaxa? no Savaysa? no Effient? no  Patient confirms/reports the following medications:  Current Outpatient Medications  Medication Sig Dispense Refill  . atorvastatin (LIPITOR) 20 MG tablet TOME UNA TABLETA POR VIA ORAL TODOS LOS DIAS (Patient taking differently: daily. TOME UNA TABLETA POR VIA ORAL TODOS LOS DIAS) 30 tablet 6  . Omega-3 Fatty Acids (FISH OIL PO) Take 1 capsule by mouth daily.      No current facility-administered medications for this visit.     Patient confirms/reports the following allergies:  No Known Allergies  No orders of the defined types were placed in this encounter.   AUTHORIZATION INFORMATION  Primary Insurance: No insurance  SCHEDULE INFORMATION: Procedure has been scheduled as follows:  Date: 10/12/2019, Time: 2:00 Location: [facility] with Dr. Oneida Alar  This Gastroenterology Pre-Precedure Review Form is being routed  to the following provider(s): Neil Crouch, PA-C

## 2019-07-18 NOTE — Patient Instructions (Addendum)
Bobby Newton   1965-11-24 MRN: [id number]    Procedure Date: 10/10/2019 Time to register: 7:30 am Place to register: [facility] Procedure Time: 8:30 am Scheduled provider: Dr. Oneida Alar  PREPARATION FOR COLONOSCOPY WITH TRI-LYTE SPLIT PREP  Please notify us immediately if you are diabetic, take iron supplements, or if you are on Coumadin or any other blood thinners.    You will need to purchase 1 fleet enema and 1 box of Bisacodyl 54m tablets.   1 DAY BEFORE PROCEDURE:  DATE: 10/09/2019  DAY: Sunday Continue clear liquids the entire day - NO SOLID FOOD.    At 2:00 pm:  Take 2 Bisacodyl tablets.   At 4:00pm:  Start drinking your solution. Make sure you mix well per instructions on the bottle. Try to drink 1 (one) 8 ounce glass every 10-15 minutes until you have consumed HALF the jug. You should complete by 6:00pm.You must keep the left over solution refrigerated until completed next day.  Continue clear liquids. You must drink plenty of clear liquids to prevent dehyration and kidney failure.     DAY OF PROCEDURE:   DATE: 10/10/2019  DAY: Monday If you take medications for your heart, blood pressure or breathing, you may take these medications.   Five hours before your procedure time @ 3:30 am:  Finish remaining amout of bowel prep, drinking 1 (one) 8 ounce glass every 10-15 minutes until complete. You have two hours to consume remaining prep.   Three hours before your procedure time @ 5:30 am:  Nothing by mouth.   At least one hour before going to the hospital:  Give yourself one Fleet enema. You may take your morning medications with sip of water unless we have instructed otherwise.      Please see below for Dietary Information.  CLEAR LIQUIDS INCLUDE:  Water Jello (NOT red in color)   Ice Popsicles (NOT red in color)   Tea (sugar ok, no milk/cream) Powdered fruit flavored drinks  Coffee (sugar ok, no milk/cream) Gatorade/ Lemonade/ Kool-Aid  (NOT red in color)    Juice: apple, white grape, white cranberry Soft drinks  Clear bullion, consomme, broth (fat free beef/chicken/vegetable)  Carbonated beverages (any kind)  Strained chicken noodle soup Hard Candy   Remember: Clear liquids are liquids that will allow you to see your fingers on the other side of a clear glass. Be sure liquids are NOT red in color, and not cloudy, but CLEAR.  DO NOT EAT OR DRINK ANY OF THE FOLLOWING:  Dairy products of any kind   Cranberry juice Tomato juice / V8 juice   Grapefruit juice Orange juice     Red grape juice  Do not eat any solid foods, including such foods as: cereal, oatmeal, yogurt, fruits, vegetables, creamed soups, eggs, bread, crackers, pureed foods in a blender, etc.   HELPFUL HINTS FOR DRINKING PREP SOLUTION:   Make sure prep is extremely cold. Mix and refrigerate the the morning of the prep. You may also put in the freezer.   You may try mixing some Crystal Light or Country Time Lemonade if you prefer. Mix in small amounts; add more if necessary.  Try drinking through a straw  Rinse mouth with water or a mouthwash between glasses, to remove after-taste.  Try sipping on a cold beverage /ice/ popsicles between glasses of prep.  Place a piece of sugar-free hard candy in mouth between glasses.  If you become nauseated, try consuming smaller amounts, or stretch out the time  between glasses. Stop for 30-60 minutes, then slowly start back drinking.        OTHER INSTRUCTIONS  You will need a responsible adult at least 53 years of age to accompany you and drive you home. This person must remain in the waiting room during your procedure. The hospital will cancel your procedure if you do not have a responsible adult with you.   1. Wear loose fitting clothing that is easily removed. 2. Leave jewelry and other valuables at home.  3. Remove all body piercing jewelry and leave at home. 4. Total time from sign-in until discharge is approximately 2-3  hours. 5. You should go home directly after your procedure and rest. You can resume normal activities the day after your procedure. 6. The day of your procedure you should not:  Drive  Make legal decisions  Operate machinery  Drink alcohol  Return to work   You may call the office (Dept: [phone]) before 5:00pm, or page the doctor on call ([phone]) after 5:00pm, for further instructions, if necessary.   Insurance Information YOU WILL NEED TO CHECK WITH YOUR INSURANCE COMPANY FOR THE BENEFITS OF COVERAGE YOU HAVE FOR THIS PROCEDURE.  UNFORTUNATELY, NOT ALL INSURANCE COMPANIES HAVE BENEFITS TO COVER ALL OR PART OF THESE TYPES OF PROCEDURES.  IT IS YOUR RESPONSIBILITY TO CHECK YOUR BENEFITS, HOWEVER, WE WILL BE GLAD TO ASSIST YOU WITH ANY CODES YOUR INSURANCE COMPANY MAY NEED.    PLEASE NOTE THAT MOST INSURANCE COMPANIES WILL NOT COVER A SCREENING COLONOSCOPY FOR PEOPLE UNDER THE AGE OF 50  IF YOU HAVE BCBS INSURANCE, YOU MAY HAVE BENEFITS FOR A SCREENING COLONOSCOPY BUT IF POLYPS ARE FOUND THE DIAGNOSIS WILL CHANGE AND THEN YOU MAY HAVE A DEDUCTIBLE THAT WILL NEED TO BE MET. SO PLEASE MAKE SURE YOU CHECK YOUR BENEFITS FOR A SCREENING COLONOSCOPY AS WELL AS A DIAGNOSTIC COLONOSCOPY.  

## 2019-07-27 NOTE — Addendum Note (Signed)
Addended by: Metro Kung on: 07/27/2019 04:46 PM   Modules accepted: Orders, SmartSet

## 2019-07-27 NOTE — Progress Notes (Signed)
Ok to schedule.

## 2019-10-04 ENCOUNTER — Telehealth: Payer: Self-pay | Admitting: *Deleted

## 2019-10-04 NOTE — Telephone Encounter (Signed)
Called pt and informed him that we had some procedure cancellations on Mon, Jan 11.  Pt agreed to reschedule his procedure to 10/10/2019 at 8:30.  Pt rescheduled his Covid screening to 10/07/2019.  Pt is aware to come by the office and pick up new prep instructions and Covid screening info.  Endo notified.

## 2019-10-06 ENCOUNTER — Telehealth: Payer: Self-pay | Admitting: *Deleted

## 2019-10-06 NOTE — Telephone Encounter (Signed)
Pt called in and had his friend to tell me that he would come by tomorrow to pick up his prep instructions.  Pt aware that we may be closed due to snow and was advised to call before coming in.  Reviewed prep instructions with pt and his friend on case we are closed tomorrow.

## 2019-10-07 ENCOUNTER — Other Ambulatory Visit: Payer: Self-pay

## 2019-10-07 ENCOUNTER — Telehealth: Payer: Self-pay | Admitting: *Deleted

## 2019-10-07 ENCOUNTER — Other Ambulatory Visit (HOSPITAL_COMMUNITY)
Admission: RE | Admit: 2019-10-07 | Discharge: 2019-10-07 | Disposition: A | Discharge status: Home / Self Care | Payer: HRSA Program | Source: Ambulatory Visit | Attending: Gastroenterology | Admitting: Gastroenterology

## 2019-10-07 DIAGNOSIS — Z20822 Contact with and (suspected) exposure to covid-19: Secondary | ICD-10-CM | POA: Diagnosis not present

## 2019-10-07 DIAGNOSIS — Z01812 Encounter for preprocedural laboratory examination: Secondary | ICD-10-CM | POA: Diagnosis present

## 2019-10-07 LAB — SARS CORONAVIRUS 2 (TAT 6-24 HRS): SARS Coronavirus 2: NEGATIVE

## 2019-10-07 NOTE — Telephone Encounter (Signed)
[phone]  Patient daughter, Michelene Heady, wants you to call her back.  She is wanting her dads instructions for his procedure sent to her

## 2019-10-07 NOTE — Telephone Encounter (Signed)
Spoke to pt and daughter.  Emailed prep instructions to pt.

## 2019-10-10 ENCOUNTER — Encounter (HOSPITAL_COMMUNITY)
Admission: RE | Disposition: A | Discharge status: Home / Self Care | Payer: Self-pay | Source: Home / Self Care | Attending: Gastroenterology

## 2019-10-10 ENCOUNTER — Encounter (HOSPITAL_COMMUNITY): Payer: Self-pay | Admitting: Gastroenterology

## 2019-10-10 ENCOUNTER — Other Ambulatory Visit: Payer: Self-pay

## 2019-10-10 ENCOUNTER — Ambulatory Visit (HOSPITAL_COMMUNITY)
Admission: RE | Admit: 2019-10-10 | Discharge: 2019-10-10 | Disposition: A | Discharge status: Home / Self Care | Payer: Self-pay | Attending: Gastroenterology | Admitting: Gastroenterology

## 2019-10-10 DIAGNOSIS — Z86010 Personal history of colonic polyps: Secondary | ICD-10-CM

## 2019-10-10 DIAGNOSIS — K648 Other hemorrhoids: Secondary | ICD-10-CM | POA: Insufficient documentation

## 2019-10-10 DIAGNOSIS — E7800 Pure hypercholesterolemia, unspecified: Secondary | ICD-10-CM | POA: Insufficient documentation

## 2019-10-10 DIAGNOSIS — K644 Residual hemorrhoidal skin tags: Secondary | ICD-10-CM | POA: Insufficient documentation

## 2019-10-10 DIAGNOSIS — Z1211 Encounter for screening for malignant neoplasm of colon: Secondary | ICD-10-CM | POA: Insufficient documentation

## 2019-10-10 DIAGNOSIS — I10 Essential (primary) hypertension: Secondary | ICD-10-CM | POA: Insufficient documentation

## 2019-10-10 DIAGNOSIS — Z8719 Personal history of other diseases of the digestive system: Secondary | ICD-10-CM | POA: Insufficient documentation

## 2019-10-10 DIAGNOSIS — Q438 Other specified congenital malformations of intestine: Secondary | ICD-10-CM | POA: Insufficient documentation

## 2019-10-10 DIAGNOSIS — K621 Rectal polyp: Secondary | ICD-10-CM

## 2019-10-10 DIAGNOSIS — Z79899 Other long term (current) drug therapy: Secondary | ICD-10-CM | POA: Insufficient documentation

## 2019-10-10 HISTORY — PX: POLYPECTOMY: SHX5525

## 2019-10-10 HISTORY — PX: COLONOSCOPY: SHX5424

## 2019-10-10 SURGERY — COLONOSCOPY
Anesthesia: Moderate Sedation

## 2019-10-10 MED ORDER — SODIUM CHLORIDE 0.9 % IV SOLN: INTRAVENOUS | Status: DC

## 2019-10-10 MED ORDER — MIDAZOLAM HCL 5 MG/5ML IJ SOLN
INTRAMUSCULAR | Status: DC | PRN
  Administered 2019-10-10 (×2): 2 mg via INTRAVENOUS

## 2019-10-10 MED ORDER — MEPERIDINE HCL 100 MG/ML IJ SOLN
INTRAMUSCULAR | Status: DC | PRN
  Administered 2019-10-10: 25 mg
  Administered 2019-10-10: 50 mg

## 2019-10-10 MED ORDER — MIDAZOLAM HCL 5 MG/5ML IJ SOLN
INTRAMUSCULAR | Status: AC
  Filled 2019-10-10: qty 5

## 2019-10-10 MED ORDER — MEPERIDINE HCL 100 MG/ML IJ SOLN
INTRAMUSCULAR | Status: AC
  Filled 2019-10-10: qty 1

## 2019-10-10 NOTE — OR Nursing (Signed)
Spanish Interpreter Verdis Frederickson [phone] used for pre-op nursing assessment.

## 2019-10-10 NOTE — Op Note (Signed)
[facility] Patient Name: Bobby Newton Procedure Date: 10/10/2019 7:34 AM MRN: [id number] Date of Birth: 01-17-1966 Attending MD: Barney Drain MD, MD CSN: [id number] Age: 54 Admit Type: Outpatient Procedure:                Colonoscopy WITH COLD SNARE POLYPECTOMY Indications:              Personal history of colonic polyps Providers:                Barney Drain MD, MD, Janeece Riggers, RN Referring MD:             Soyla Dryer PA-C, PA Medicines:                Meperidine 75 mg IV, Midazolam 4 mg IV Complications:            No immediate complications. Estimated Blood Loss:     Estimated blood loss was minimal. Procedure:                Pre-Anesthesia Assessment:                           - Prior to the procedure, a History and Physical                            was performed, and patient medications and                            allergies were reviewed. The patient's tolerance of                            previous anesthesia was also reviewed. The risks                            and benefits of the procedure and the sedation                            options and risks were discussed with the patient.                            All questions were answered, and informed consent                            was obtained. Prior Anticoagulants: The patient has                            taken no previous anticoagulant or antiplatelet                            agents. ASA Grade Assessment: I - A normal, healthy                            patient. After reviewing the risks and benefits,                            the patient was deemed in satisfactory condition to  undergo the procedure. After obtaining informed                            consent, the colonoscope was passed under direct                            vision. Throughout the procedure, the patient's                            blood pressure, pulse, and oxygen saturations were           monitored continuously. The [id number]                            scope was introduced through the anus and advanced                            to the the cecum, identified by appendiceal orifice                            and ileocecal valve. The colonoscopy was somewhat                            difficult due to a tortuous colon. Successful                            completion of the procedure was aided by                            straightening and shortening the scope to obtain                            bowel loop reduction and COLOWRAP. The patient                            tolerated the procedure well. The quality of the                            bowel preparation was excellent. The ileocecal                            valve, appendiceal orifice, and rectum were                            photographed. Scope In: 9:07:21 AM Scope Out: 9:26:43 AM Scope Withdrawal Time: 0 hours 15 minutes 6 seconds  Total Procedure Duration: 0 hours 19 minutes 22 seconds  Findings:      A 3 mm polyp was found in the rectum. The polyp was sessile. The polyp       was removed with a cold snare. Resection and retrieval were complete.      External and internal hemorrhoids were found.      The recto-sigmoid colon, sigmoid colon and descending colon were       moderately tortuous. Impression:               -  One 3 mm polyp in the rectum, removed with a cold                            snare. Resected and retrieved.                           - External and internal hemorrhoids.                           - Tortuous LEFT colon. Moderate Sedation:      Moderate (conscious) sedation was administered by the endoscopy nurse       and supervised by the endoscopist. The following parameters were       monitored: oxygen saturation, heart rate, blood pressure, and response       to care. Total physician intraservice time was 32 minutes. Recommendation:           - Patient has a contact number  available for                            emergencies. The signs and symptoms of potential                            delayed complications were discussed with the                            patient. Return to normal activities tomorrow.                            Written discharge instructions were provided to the                            patient.                           - High fiber diet.                           - Continue present medications.                           - Await pathology results.                           - Repeat colonoscopy in 5-10 years for surveillance. Procedure Code(s):        --- Professional ---                           [id number], Colonoscopy, flexible; with removal of                            tumor(s), polyp(s), or other lesion(s) by snare                            technique  [id number], Moderate sedation; each additional 15                            minutes intraservice time                           G0500, Moderate sedation services provided by the                            same physician or other qualified health care                            professional performing a gastrointestinal                            endoscopic service that sedation supports,                            requiring the presence of an independent trained                            observer to assist in the monitoring of the                            patient's level of consciousness and physiological                            status; initial 15 minutes of intra-service time;                            patient age 18 years or older (additional time may                            be reported with [phone], as appropriate) Diagnosis Code(s):        --- Professional ---                           K62.1, Rectal polyp                           K64.8, Other hemorrhoids                           Z86.010, Personal history of colonic polyps                            Q43.8, Other specified congenital malformations of                            intestine CPT copyright 2019 American Medical Association. All rights reserved. The codes documented in this report are preliminary and upon coder review may  be revised to meet current compliance requirements. Barney Drain, MD Barney Drain MD, MD 10/10/2019 9:43:21 AM This report has been signed electronically. Number of Addenda: 0

## 2019-10-10 NOTE — OR Nursing (Signed)
Interpreter used omar [phone] to speak on risks of procedure. Pt voiced understanding

## 2019-10-10 NOTE — H&P (Signed)
Primary Care Physician:  Soyla Dryer, PA-C Primary Gastroenterologist:  Dr. Oneida Alar  Pre-Procedure History & Physical: HPI:  Bobby Newton is a 54 y.o. male here for COLON CANCER SCREENING. SPANISH SPEAKING ONLY-HISTORY OBTAINED VIA INTERPRETER-[id number].  Past Medical History:  Diagnosis Date  . Hypercholesterolemia   . Hypertension     Past Surgical History:  Procedure Laterality Date  . COLONOSCOPY  2009   Dr. Oneida Alar: multiple tape worms in small intestine and colon. simple adenoma. Surveillance in 5 years  . COLONOSCOPY N/A 06/07/2014   Procedure: COLONOSCOPY;  Surgeon: Danie Binder, MD;  Location: [facility];  Service: Endoscopy;  Laterality: N/A;  8:30    Prior to Admission medications   Medication Sig Start Date End Date Taking? Authorizing Provider  atorvastatin (LIPITOR) 20 MG tablet TOME UNA TABLETA POR VIA ORAL TODOS LOS DIAS Patient taking differently: Take 20 mg by mouth daily. TOME UNA TABLETA POR VIA ORAL TODOS LOS DIAS 04/20/19  Yes Soyla Dryer, PA-C  Omega-3 Fatty Acids (FISH OIL PO) Take 1 capsule by mouth once a week.    Yes [provider]  Fiber POWD Take 1 Dose by mouth daily as needed (constipation).    [provider]    Allergies as of 07/27/2019  . (No Known Allergies)    Family History  Problem Relation Age of Onset  . Colon cancer Neg Hx     Social History   Socioeconomic History  . Marital status: Married    Spouse name: Not on file  . Number of children: Not on file  . Years of education: Not on file  . Highest education level: Not on file  Occupational History  . Not on file  Tobacco Use  . Smoking status: Never Smoker  . Smokeless tobacco: Never Used  Substance and Sexual Activity  . Alcohol use: No  . Drug use: No  . Sexual activity: Not on file  Other Topics Concern  . Not on file  Social History Narrative  . Not on file   Social Determinants of Health   Financial Resource Strain:   .  Difficulty of Paying Living Expenses: Not on file  Food Insecurity:   . Worried About Charity fundraiser in the Last Year: Not on file  . Ran Out of Food in the Last Year: Not on file  Transportation Needs:   . Lack of Transportation (Medical): Not on file  . Lack of Transportation (Non-Medical): Not on file  Physical Activity:   . Days of Exercise per Week: Not on file  . Minutes of Exercise per Session: Not on file  Stress:   . Feeling of Stress : Not on file  Social Connections:   . Frequency of Communication with Friends and Family: Not on file  . Frequency of Social Gatherings with Friends and Family: Not on file  . Attends Religious Services: Not on file  . Active Member of Clubs or Organizations: Not on file  . Attends Archivist Meetings: Not on file  . Marital Status: Not on file  Intimate Partner Violence:   . Fear of Current or Ex-Partner: Not on file  . Emotionally Abused: Not on file  . Physically Abused: Not on file  . Sexually Abused: Not on file    Review of Systems: See HPI, otherwise negative ROS   Physical Exam: BP 140/73   Pulse 60   Temp 98.2 F (36.8 C) (Oral)   Resp 18   Ht 5'  2" (1.575 m)   Wt 72.6 kg   SpO2 100%   BMI 29.26 kg/m  General:   Alert,  pleasant and cooperative in NAD Head:  Normocephalic and atraumatic. Neck:  Supple; Lungs:  Clear throughout to auscultation.    Heart:  Regular rate and rhythm. Abdomen:  Soft, nontender and nondistended. Normal bowel sounds, without guarding, and without rebound.   Neurologic:  Alert and  oriented x4;  grossly normal neurologically.  Impression/Plan:    SCREENING  Plan:  1. TCS TODAY DISCUSSED PROCEDURE, BENEFITS, & RISKS: < 1% chance of medication reaction, bleeding, perforation, ASPIRATION, or rupture of spleen/liver requiring surgery to fix it and missed polyps < 1 cm 10-20% of the time.

## 2019-10-10 NOTE — Discharge Instructions (Signed)
Te quitaron 1 plipo pequeo. Tienes hemorroides internas.  SIGUE UNA DIETA ALTA EN FIBRA. EVITE LOS ELEMENTOS QUE CAUSAN HINCHAZN. CONSULTE LA SIGUIENTE INFORMACIN.  LOS RESULTADOS DE LA BIOPSIA VOLVERN EN 5 DAS HBILES.  POR FAVOR LLAME CUANDO USTED QUIERE UNA REFERENCIA DE CIRUGA PARA SU HERNIA UMBILICAL.  Siguiente colonoscopia COMO JAN 2026 Y NO MS TARDE DE JAN 2028.      You had 1 small polyp removed. You have internal hemorrhoids. FOLLOW A HIGH FIBER DIET. AVOID ITEMS THAT CAUSE BLOATING. SEE INFO BELOW. YOUR BIOPSY RESULTS WILL BE BACK IN 5 BUSINESS DAYS. Next colonoscopy AS EARLY AS JAN 2026 AND NO LATER THAN JAN 2028.  PLEASE CALL WHEN YOU WANT A SURGERY REFERRAL FOR YOUR UMBILICAL HERNIA.    Colonoscopa: cuidados posteriores (Colonoscopy, Care After) Siga estas instrucciones durante las prximas semanas. Estas indicaciones le proporcionan informacin general acerca de cmo deber cuidarse despus del procedimiento. El mdico tambin podr darle instrucciones ms especficas. El tratamiento ha sido planificado segn las prcticas mdicas actuales, pero en algunos casos pueden ocurrir problemas. Comunquese con el mdico si tiene algn problema o tiene dudas despus del procedimiento. QU ESPERAR DESPUS DEL PROCEDIMIENTO  Despus del procedimiento, es comn tener las siguientes sensaciones:  Una pequea cantidad de sangre en la materia fecal.  Cantidades moderadas de gases e hinchazn o calambres abdominales leves. INSTRUCCIONES PARA EL CUIDADO EN EL HOGAR  No conduzca vehculos, opere maquinarias ni firme documentos importantes durante 24horas.  Puede ducharse y retomar sus actividades fsicas habituales, pero muvase a un ritmo ms lento durante las primeras 24horas.  Tmese descansos frecuentes durante las primeras 24horas.  Camine o colquese compresas calientes en el abdomen para ayudar a reducir los calambres e hinchazn abdominales.  Beba suficiente  lquido para Consulting civil engineer orina clara o de color amarillo plido.  Puede retomar su dieta normal segn las instrucciones de su mdico. Evite los alimentos pesados o fritos que son difciles de Publishing copy.  Evite consumir alcohol durante 24horas o segn las instrucciones de su mdico.  Tome solo medicamentos de venta libre o recetados, segn las indicaciones del mdico.  Si se obtuvo una muestra de tejido (biopsia) durante el procedimiento:  No tome aspirina ni anticoagulantes durante 7das, o segn las instrucciones de su mdico.  No consuma alcohol durante 7das o segn las instrucciones de su mdico.  Consuma alimentos livianos durante las primeras 24horas. SOLICITE ATENCIN MDICA SI: Tiene manchas persistentes de sangre en la materia fecal entre 2 y 3das posteriores al procedimiento. SOLICITE ATENCIN MDICA DE INMEDIATO SI:  Tiene ms que una pequea mancha de sangre en la materia fecal.  Elimina grandes cogulos de sangre en la materia fecal.  Tiene el abdomen hinchado (distendido).  Tiene nuseas o vmitos.  Tiene fiebre.  Siente dolor intenso en el abdomen que no se alivia con los [employer].  Plipos en el colon  (Colon Polyps) Los plipos son masas de tejido que crecen dentro del cuerpo. Los plipos pueden desarrollarse en el intestino grueso (colon). La mayora de los plipos son no cancerosos (benignos). Sin embargo, algunos plipos pueden convertirse en cancerosos con el tiempo. Los plipos que sean ms grandes que un guisante pueden ser [employer]. Para estar seguros, los mdicos extirpan y [employer] plipos.  CAUSAS  Se forman cuando ciertas mutaciones genticas hacen que las clulas se desarrollen y se dividan por dems.  FACTORES DE RIESGO  Hay un nmero de factores de riesgo que pueden aumentar las probabilidades de padecer plipos en  el colon. Ellos son:   Ser mayor de 69 aos de edad.  Historia familiar de cncer o plipos de colon.  Ciertas  enfermedades crnicas como la colitis o la enfermedad de Crohn.  Tener sobrepeso.  El hbito de fumar.  El sedentarismo.  Beber alcohol en exceso. SNTOMAS  La mayor parte de los plipos no causa sntomas. Si se presentan sntomas, stos pueden ser:   Parker Hannifin materia fecal. Heces de color rojo oscuro o negro.  Constipacin o diarrea que duran ms de 1 semana. DIAGNSTICO  [place] persona con frecuencia no sabe que tiene plipos [employer] su mdico los halla durante un examen fsico de [place]. El mdico puede usar 4 tipos de pruebas para Hydrographic surveyor plipos:   Examen Musician. Se colocar guantes y palpar el interior del recto. Esta prueba detectar solo plipos en el recto.  Enema de bario. El mdico introduce un lquido llamado bario en el recto y luego toma radiografas del colon. El bario hace que el colon se vea blanco. Los plipos son de color oscuro, por lo que son fciles de Chiropodist.  Sigmoideoscopia. Se coloca un tubo delgado y flexible (sigmoideoscopio) en el recto. Este sigmoideoscopio tiene [place] fuente de luz y Ardelia Mems pequea cmara de video. El mdico [place] el sigmoideoscopio para observar el ltimo tercio del colon.  Colonoscopa. Esta prueba es similar a la sigmoideoscopia, pero el mdico examina todo el colon. Este es el mtodo ms frecuente para Hydrographic surveyor y extirpar los plipos.  PREVENCIN  Para disminuir los riesgos de volver a Best boy plipos en el colon:   Coma mucha fruta y [place]. Evite las comidas grasas.  No fume.  Evite consumir alcohol.  [place].  Baje de peso segn las indicaciones de su mdico.  Consuma mucho clcio y folato. Las comidas que contienen calcio son la [place], los quesos y el brcoli. Las comidas que contienen folato son los garbanzos, los frijoles rojos y Nurse, mental health.  Dieta rica en fibra  (High-Fiber Diet)  La fibra, tambin llamada fibra dietaria, es un tipo de carbohidrato que se  encuentra en las frutas, las verduras, los cereales integrales y los frijoles. Una dieta rica en fibra puede tener muchos beneficios para la salud. El mdico puede recomendar una dieta rica en fibra para ayudar a:  Contractor. La fibra puede hacer que defeque con ms frecuencia.  Disminuir el nivel de colesterol.  [place] hemorroides, la diverticulosis no complicada o el sndrome del intestino irritable.  Evitar comer en exceso como parte de un plan para bajar de peso.  Evitar cardiopatas, la diabetes tipo 2 y ciertos cnceres. EN QU CONSISTE EL PLAN?  El consumo diario recomendado de fibra incluye lo siguiente:  8 gramos para hombres menores de 55 aos.  30 gramos para hombres mayores de 50 aos.  25 gramos para mujeres menores de 50 aos.  21 gramos para mujeres mayores de 50 aos. Puede lograr el consumo diario recomendado de fibra si come una variedad de frutas, verduras, cereales y frijoles. El mdico tambin puede recomendar un suplemento de fibra si no es posible obtener suficiente fibra a travs de la dieta.  QU DEBO SABER ACERCA DE [place]?  La eficacia de los suplementos de [place] no ha sido estudiada [place], de modo que es mejor obtener fibra a travs de los alimentos.  Verifique siempre el contenido de fibra en la etiqueta de informacin nutricional de los  alimentos preenvasados. Busque alimentos que contengan al menos 5 gramos de fibra por porcin.  Consulte al nutricionista si tiene preguntas sobre algunos alimentos especficos relacionados con su enfermedad, especialmente si estos alimentos no se mencionan a continuacin.  Aumente el consumo diario de fibra en forma gradual. Aumentar demasiado rpido el consumo de fibra dietaria puede provocar meteorismo, clicos o gases.  Beber abundante agua. El [place] a Economist. QU ALIMENTOS PUEDO COMER?  Cereales  Panes integrales. Multicereales. Avena. Arroz integral. Dwyane Luo. Trigo  burgol. Mijo. Muffins de salvado. Palomitas de maz. Galletas de centeno.  Verduras  Batatas. [place]. Col rizada. [place]. Repollo. Brcoli. Guisantes. [place]an[place]orias. [place].  Frutas  Frutos rojos. Peras. Manzanas. [place]. Ciruelas y pasas. Higos secos.  Carnes y otras fuentes de protenas  Frijoles blancos, colorados, pintos y porotos de soja. Guisantes secos. Lentejas. Frutos secos y semillas.  Lcteos  Yogur fortificado con Pharmacist, hospital.  Bebidas  Leche de soja fortificada con Fredderick Phenix. Jugo de naranja fortificado con Fredderick Phenix.  Otros  Barras de [place].  Los artculos mencionados arriba pueden no ser [employer] de las bebidas o los alimentos recomendados. Comunquese con el nutricionista para conocer ms opciones.  QU ALIMENTOS NO SE RECOMIENDAN?  Cereales  Pan blanco. Pastas hechas con Letitia Neri. Arroz blanco.  Verduras  Papas fritas. Verduras enlatadas. Verduras bien cocidas.  Frutas  Jugo de frutas. Frutas cocidas coladas.  Carnes y otras fuentes de protenas  Cortes de carne con Lobbyist. Aves o pescados fritos.  Lcteos  Leche. Yogur. Queso crema. [employer].  Bebidas  Gaseosas.  Otros  Tortas y pasteles. [place] y aceites.  Los artculos mencionados arriba pueden no ser [employer] de las bebidas y los alimentos que se Higher education careers adviser. Comunquese con el nutricionista para obtener ms informacin.  ALGUNOS CONSEJOS PARA INCLUIR ALIMENTOS RICOS EN FIBRA EN LA DIETA  Consuma una gran variedad de alimentos ricos en fibra.  Asegrese de que la mitad de todos los cereales consumidos cada da sean cereales integrales.  Reemplace los panes y cereales hechos de harina refinada o harina blanca por panes y cereales integrales.  Reemplace el arroz blanco por arroz integral, trigo burgol o mijo.  Comience Games developer con un desayuno rico en [place], como un cereal que contenga al menos 5 gramos de fibra por porcin.  Use guisantes en lugar de carne en las sopas,  ensaladas o pastas.  Coma bocadillos ricos en fibra, como frutos rojos, verduras crudas, frutos secos o palomitas de maz.

## 2019-10-11 ENCOUNTER — Other Ambulatory Visit (HOSPITAL_COMMUNITY): Payer: Self-pay

## 2019-10-11 LAB — SURGICAL PATHOLOGY

## 2019-10-12 ENCOUNTER — Telehealth: Payer: Self-pay | Admitting: Gastroenterology

## 2019-10-12 NOTE — Telephone Encounter (Signed)
Please call pt. He had ONE HYPERPLASTIC POLYP removed from her colon.  FOLLOW A HIGH FIBER DIET. AVOID ITEMS THAT CAUSE BLOATING.  Next colonoscopy AS EARLY AS JAN 2026 AND NO LATER THAN JAN 2028.  PLEASE CALL WHEN YOU WANT A SURGERY REFERRAL FOR YOUR UMBILICAL HERNIA.

## 2019-10-12 NOTE — Telephone Encounter (Signed)
Cc'ed to pcp °

## 2019-10-12 NOTE — Telephone Encounter (Signed)
Spoke with pts daughter. She is going to call back with pt so results can be discussed.

## 2019-10-19 NOTE — Telephone Encounter (Signed)
PT's daughter answered the phone. Pt is not there. She said he has not had a chance to call back. I told her I will mail his results and if he wants her to read it to him, he will let her know. She said that will work.

## 2019-10-27 ENCOUNTER — Other Ambulatory Visit: Payer: Self-pay

## 2019-10-27 ENCOUNTER — Other Ambulatory Visit (HOSPITAL_COMMUNITY)
Admission: RE | Admit: 2019-10-27 | Discharge: 2019-10-27 | Disposition: A | Discharge status: Home / Self Care | Payer: Self-pay | Source: Ambulatory Visit | Attending: Physician Assistant | Admitting: Physician Assistant

## 2019-10-27 DIAGNOSIS — Z125 Encounter for screening for malignant neoplasm of prostate: Secondary | ICD-10-CM | POA: Insufficient documentation

## 2019-10-27 DIAGNOSIS — E785 Hyperlipidemia, unspecified: Secondary | ICD-10-CM | POA: Insufficient documentation

## 2019-10-27 LAB — COMPREHENSIVE METABOLIC PANEL
ALT: 32 U/L (ref 0–44)
AST: 25 U/L (ref 15–41)
Albumin: 4.2 g/dL (ref 3.5–5.0)
Alkaline Phosphatase: 68 U/L (ref 38–126)
Anion gap: 7 (ref 5–15)
BUN: 13 mg/dL (ref 6–20)
CO2: 26 mmol/L (ref 22–32)
Calcium: 9 mg/dL (ref 8.9–10.3)
Chloride: 106 mmol/L (ref 98–111)
Creatinine, Ser: 0.94 mg/dL (ref 0.61–1.24)
GFR calc Af Amer: >60 mL/min (ref >60)
GFR calc non Af Amer: >60 mL/min (ref >60)
Glucose, Bld: 105 mg/dL — ABNORMAL HIGH (ref 70–99)
Potassium: 4.5 mmol/L (ref 3.5–5.1)
Sodium: 139 mmol/L (ref 135–145)
Total Bilirubin: 0.8 mg/dL (ref 0.3–1.2)
Total Protein: 7.1 g/dL (ref 6.5–8.1)

## 2019-10-27 LAB — LIPID PANEL
Cholesterol: 225 mg/dL — ABNORMAL HIGH (ref 0–200)
HDL: 43 mg/dL (ref >40)
LDL Cholesterol: 141 mg/dL — ABNORMAL HIGH (ref 0–99)
Total CHOL/HDL Ratio: 5.2 RATIO
Triglycerides: 203 mg/dL — ABNORMAL HIGH (ref <150)
VLDL: 41 mg/dL — ABNORMAL HIGH (ref 0–40)

## 2019-10-27 LAB — PSA: Prostatic Specific Antigen: 0.34 ng/mL (ref 0.00–4.00)

## 2019-10-31 ENCOUNTER — Ambulatory Visit: Payer: Self-pay | Admitting: Physician Assistant

## 2019-10-31 ENCOUNTER — Other Ambulatory Visit: Payer: Self-pay

## 2019-10-31 ENCOUNTER — Encounter: Payer: Self-pay | Admitting: Physician Assistant

## 2019-10-31 VITALS — BP 140/76 | HR 98 | Temp 97.6°F | Wt 162.7 lb

## 2019-10-31 DIAGNOSIS — Z789 Other specified health status: Secondary | ICD-10-CM

## 2019-10-31 DIAGNOSIS — I10 Essential (primary) hypertension: Secondary | ICD-10-CM

## 2019-10-31 DIAGNOSIS — E785 Hyperlipidemia, unspecified: Secondary | ICD-10-CM

## 2019-10-31 MED ORDER — ATORVASTATIN CALCIUM 20 MG PO TABS: ORAL_TABLET | ORAL | 6 refills | Status: DC

## 2019-10-31 MED ORDER — LISINOPRIL 10 MG PO TABS: ORAL_TABLET | ORAL | 4 refills | Status: DC

## 2019-10-31 NOTE — Patient Instructions (Addendum)
Colesterol elevado High Cholesterol  El colesterol elevado es una afeccin en la que la sangre tiene niveles altos de una sustancia blanca, cerosa y parecida a la grasa (colesterol). El organismo humano necesita una pequea cantidad de colesterol. El hgado fabrica todo el colesterol que el organismo necesita. El exceso de colesterol proviene de los alimentos que comemos. La sangre transporta el colesterol desde el hgado a travs de los vasos sanguneos. Si tiene el colesterol elevado, este puede depositarse (formar placas) en las paredes de los vasos sanguneos (arterias). Las placas provocan el estrechamiento y la rigidez de las arterias. Las placas de colesterol aumentan el riesgo de sufrir un infarto de miocardio y un accidente cerebrovascular. Trabaje con el mdico para mantener las concentraciones de colesterol en un rango saludable. Qu incrementa el riesgo? Es ms probable que esta afeccin se manifieste en las personas que:  Consumen alimentos con alto contenido de grasa animal (grasa saturada) o colesterol.  Tienen sobrepeso.  No hacen suficiente ejercicio fsico.  Tienen antecedentes familiares de colesterol elevado. Cules son los signos o los sntomas? Esta afeccin no presenta sntomas. Cmo se diagnostica? Esta afeccin podra diagnosticarse a partir de los resultados de anlisis de sangre.  Si es mayor de 20aos, es posible que el mdico le controle el colesterol cada 4a6aos.  Los controles pueden ser ms frecuentes si ya tuvo el colesterol elevado u otros factores de riesgo de enfermedades cardacas. En el anlisis de sangre de colesterol, se determina lo siguiente:  El colesterol "malo" (colesterol LDL). Este es el principal tipo de colesterol que causa enfermedades cardacas. El nivel recomendado de LDL es de menos de100.  El colesterol "bueno" (colesterol HDL). Este tipo ayuda a proteger contra las enfermedades cardacas limpiando las arterias y arrastrando el  LDL. El nivel recomendado de HDL es de60 o superior.  Triglicridos. Estos son grasas que el organismo puede almacenar o quemar como fuente de energa. El nivel recomendado de triglicridos es de menos de 150.  Colesterol total. Esta es una medicin de la cantidad total de colesterol en la sangre, que incluye el colesterol LDL, el colesterol HDL y los triglicridos. El valor saludable es de menos de200. Cmo se trata? Esta afeccin se trata con cambios en la dieta y en el estilo de vida, y con medicamentos. Cambios en la dieta  Entre ellos, la ingesta de una mayor cantidad de cereales integrales, frutas, verduras, frutos secos y pescado.  Tambin podran incluir la reduccin del consumo de carnes rojas y alimentos con mucho azcar agregado. Cambios en el estilo de vida  Entre ellos, realizar sesiones de ejercicios aerbicos durante, por lo menos, 40minutos, 3veces por semana. Por ejemplo, caminar, andar en bicicleta y nadar. Los ejercicios aerbicos junto con una dieta sana pueden ayudar a que se mantenga en un peso saludable.  Los cambios tambin podran incluir dejar de fumar. Medicamentos  Por lo general, se administran medicamentos si con los cambios en la alimentacin y en el estilo de vida no se logra reducir el colesterol hasta niveles saludables.  El mdico podra recetarle estatinas. Se ha demostrado que las estatinas disminuyen los niveles de colesterol, lo que puede reducir el riesgo de padecer una enfermedad cardaca. Siga estas indicaciones en su casa: Comida y bebida Si se lo indic el mdico:  Coma pollo (sin piel), pescado, ternera, mariscos, pechuga de pavo molida y cortes de carne roja de pulpa o de lomo.  No coma alimentos fritos ni carnes grasosas, como salchichas y salame.  Coma muchas   frutas, como manzanas.  Coma gran cantidad de verduras, como brcoli, papas y zanahorias.  Coma porotos, guisantes secos y lentejas.  Coma cereales, como cebada, arroz,  cuscs y trigo burgol.  Coma pastas sin salsas con crema.  [place], y coma yogures y quesos descremados o semidescremados.  No coma ni beba Mattel, crema, helado, yemas de huevo ni quesos duros.  No coma margarinas en barra ni untables que contengan grasas trans (que tambin se conocen como aceites parcialmente hidrogenados).  No coma aceites tropicales saturados, como el de coco y el de [place].  No coma tortas, galletas, galletitas ni otros productos horneados que contengan grasas trans.  Instrucciones generales  Haga ejercicio segn las indicaciones del mdico. Aumente la cantidad de ejercicio fsico que realiza mediante actividades como jardinera, salir a Writer o usar las escaleras.  Tome los medicamentos de venta libre y los recetados solamente como se lo haya indicado el mdico.  No consuma ningn producto que contenga nicotina o tabaco, como cigarrillos y Psychologist, sport and exercise. Si necesita ayuda para dejar de fumar, consulte al mdico.  Concurra a todas las visitas de control como se lo haya indicado el mdico. Esto es importante. Comunquese con un mdico si:  Tiene dificultad para seguir una dieta sana o mantener un peso saludable.  Necesita ayuda para comenzar un programa de ejercicios.  Necesita ayuda para dejar de fumar. Solicite ayuda de inmediato si:  Electronics engineer.  Tiene dificultad para respirar. Esta informacin no tiene Marine scientist el consejo del mdico. Asegrese de hacerle al mdico cualquier pregunta que tenga. Document Revised: 12/22/2016 Document Reviewed: 03/15/2016 Elsevier Patient Education  [place]. ---------------------------------------------------------------------------  Hipertensin en los adultos Hypertension, Adult La presin arterial alta (hipertensin) se produce cuando la fuerza de la sangre bombea a travs de las arterias con mucha fuerza. Las arterias son los vasos  sanguneos que transportan la sangre desde el corazn al resto del cuerpo. La hipertensin hace que el corazn haga ms esfuerzo para Chiropodist y [employer] que las arterias se Teacher, music o Advertising account executive. La hipertensin no tratada o no controlada puede causar infarto de miocardio, insuficiencia cardaca, accidente cerebrovascular, enfermedad renal y otros problemas. Una lectura de la presin arterial consta de un nmero ms alto sobre un nmero ms bajo. En condiciones ideales, la presin arterial debe estar por debajo de 120/80. El primer nmero ("superior") es la presin sistlica. Es la medida de la presin de las arterias cuando el corazn late. El segundo nmero ("inferior") es la presin diastlica. Es la medida de la presin en las arterias cuando el corazn se relaja. Cules son las causas? Se desconoce la causa exacta de esta afeccin. Hay algunas afecciones que causan presin arterial alta o estn relacionadas con ella. Qu incrementa el riesgo? Algunos factores de riesgo de hipertensin estn bajo su control. Los siguientes factores pueden hacer que sea ms propenso a Armed forces training and education officer afeccin:  Fumar.  Tener diabetes mellitus tipo 2, colesterol alto, o ambos.  No hacer la cantidad suficiente de actividad fsica o ejercicio.  Tener sobrepeso.  Consumir mucha grasa, azcar, caloras o sal (sodio) en su dieta.  Beber alcohol en exceso. Algunos factores de riesgo para la presin arterial alta pueden ser difciles o imposibles de [place]ar[place] manager. Algunos de estos factores son los siguientes:  Tener enfermedad renal crnica.  Tener antecedentes familiares de presin arterial alta.  Edad. Los riesgos aumentan con la edad.  Raza. El riesgo es mayor para las  personas afroamericanas.  Sexo. Antes de los 45aos, los hombres corren ms [employer]. Despus de los 65aos, las mujeres corren ms [employer].  Tener apnea obstructiva del sueo.  Estrs. Cules  son los signos o los sntomas? Es posible que la presin arterial alta puede no cause sntomas. La presin arterial muy alta (crisis hipertensiva) puede provocar:  Dolor de cabeza.  Ansiedad.  Falta de aire.  Hemorragia nasal.  Nuseas y vmitos.  Cambios en la visin.  Dolor de pecho intenso.  Convulsiones. Cmo se diagnostica? Esta afeccin se diagnostica al medir su presin arterial mientras se encuentra sentado, con el brazo apoyado sobre una superficie plana, las piernas sin cruzar y los pies bien apoyados en el piso. El brazalete del tensimetro debe colocarse directamente sobre la piel de la parte superior del brazo y al nivel de su corazn. Debe medirla al [facility] veces en el mismo brazo. Determinadas condiciones pueden causar una diferencia de presin arterial entre el brazo izquierdo y Insurance underwriter. Ciertos factores pueden provocar que las lecturas de la presin arterial sean inferiores o superiores a lo normal por un perodo corto de tiempo:  Si su presin arterial es ms alta cuando se encuentra en el consultorio del mdico que cuando la mide en su hogar, se denomina "hipertensin de bata blanca". La [employer] de las personas que tienen esta afeccin no deben ser [employer].  Si su presin arterial es ms alta en el hogar que cuando se encuentra en el consultorio del mdico, se denomina "hipertensin enmascarada". La [employer] de las personas que tienen esta afeccin deben ser medicadas para Chief Technology Officer la presin arterial. Si tiene una lecturas de presin arterial alta durante una visita o si tiene presin arterial normal con otros factores de riesgo, se le podr pedir que haga lo siguiente:  Que regrese otro da para volver a Chief Technology Officer su presin arterial nuevamente.  Que se controle la presin arterial en su casa durante 1 semana o ms. Si se le diagnostica hipertensin, es posible que se le realicen otros anlisis de sangre o estudios de diagnstico por imgenes para ayudar a  su mdico a comprender su riesgo general de tener otras afecciones. Cmo se trata? Esta afeccin se trata haciendo cambios saludables en el estilo de vida, tales como ingerir alimentos saludables, realizar ms ejercicio y reducir el consumo de alcohol. El mdico puede recetarle medicamentos si los cambios en el estilo de vida no son suficientes para Child psychotherapist la presin arterial y si:  Su presin arterial sistlica est por encima de 130.  Su presin arterial diastlica est por encima de 80. La presin arterial deseada puede variar en funcin de las enfermedades, la edad y otros factores personales. Siga estas instrucciones en su casa: Comida y bebida   Siga una dieta con alto contenido de fibras y [place], y con bajo contenido de sodio, Location manager agregada y Physicist, medical. Un ejemplo de plan alimenticio es la dieta DASH (Dietary Approaches to Stop Hypertension, Mtodos alimenticios para detener la hipertensin). Para alimentarse de esta manera: ? Coma mucha fruta y [place]. Trate de que la mitad del plato de cada comida sea de frutas y verduras. ? Coma cereales integrales, como pasta integral, arroz integral o pan integral. Llene aproximadamente un cuarto del plato con cereales integrales. ? Coma y beba productos lcteos con bajo contenido de grasa, como leche descremada o yogur bajo en grasas. ? Evite la ingesta de cortes de carne grasa, carne procesada o curada, y  carne de ave con piel. Llene aproximadamente un cuarto del plato con protenas magras, como pescado, pollo sin piel, frijoles, huevos o tofu. ? Evite ingerir alimentos prehechos y procesados. En general, estos tienen mayor cantidad de sodio, azcar agregada y Wendee Copp.  Reduzca su ingesta diaria de sodio. La mayora de las personas que tienen hipertensin deben comer menos de 1500 mg de sodio por [employer].  No beba alcohol si: ? Su mdico le indica no hacerlo. ? Est embarazada, puede estar embarazada o est tratando de quedar  embarazada.  Si bebe alcohol: ? Limite la cantidad que bebe a lo siguiente:  De 0 a 1 medida por da para las mujeres.  De 0 a 2 medidas por da para los hombres. ? Est atento a la cantidad de alcohol que hay en las bebidas que toma. En los [place], una medida equivale a una botella de cerveza de 12oz (329ml), un vaso de vino de 5oz (163ml) o un vaso de una bebida alcohlica de alta graduacin de 1oz (75ml). Estilo de vida   Trabaje con su mdico para mantener un peso saludable o Administrator, Civil Service. Pregntele cul es el peso recomendado para usted.  Haga al menos 4minutos de ejercicio la [employer] de la [place]. Estas actividades pueden incluir caminar, nadar o andar en bicicleta.  Incluya ejercicios para fortalecer sus msculos (ejercicios de resistencia), como [place] o levantamiento de pesas, como parte de su rutina semanal de ejercicios. Intente realizar 29minutos de este tipo de ejercicios al [employer] a la [place].  No consuma ningn producto que contenga nicotina o tabaco, como cigarrillos, cigarrillos electrnicos y tabaco de Higher education careers adviser. Si necesita ayuda para dejar de fumar, consulte al mdico.  Contrlese la presin arterial en su casa segn las indicaciones del mdico.  Concurra a todas las visitas de seguimiento como se lo haya indicado el mdico. Esto es importante. Medicamentos  [employer] de venta libre y los recetados solamente como se lo haya indicado el mdico. Siga cuidadosamente las indicaciones. Los medicamentos para la presin arterial deben tomarse segn las indicaciones.  No omita las dosis de medicamentos para la presin arterial. Si lo hace, estar en riesgo de tener problemas y puede hacer que los medicamentos sean menos eficaces.  Pregntele a su mdico a qu efectos secundarios o reacciones a los Careers information officer. Comunquese con un mdico si:  Piensa que tiene una reaccin a un medicamento que est  tomando.  Tiene dolores de cabeza frecuentes (recurrentes).  Se siente mareado.  Tiene hinchazn en los tobillos.  Tiene problemas de visin. Solicite ayuda inmediatamente si:  Siente un dolor de cabeza intenso o confusin.  Siente debilidad inusual o adormecimiento.  Siente que va a desmayarse.  Siente un dolor intenso en el pecho o el abdomen.  Vomita repetidas veces.  Tiene dificultad para respirar. Resumen  La hipertensin se produce cuando la sangre bombea en las arterias con mucha fuerza. Si esta afeccin no se controla, podra correr riesgo de tener complicaciones graves.  La presin arterial deseada puede variar en funcin de las enfermedades, la edad y otros factores personales. Para la [employer], una presin arterial normal es menor que 120/80.  La hipertensin se trata con cambios en el estilo de vida, medicamentos o una combinacin de [place]. Los [employer] Corporation estilo de vida incluyen prdida de peso, ingerir alimentos sanos, seguir una dieta baja en sodio, hacer ms ejercicio y Environmental consultant consumo de alcohol. Esta informacin  no tiene Marine scientist el consejo del mdico. Asegrese de hacerle al mdico cualquier pregunta que tenga. Document Revised: 07/01/2018 Document Reviewed: 07/01/2018 Elsevier Patient Education  [place].

## 2019-10-31 NOTE — Progress Notes (Signed)
BP 140/76   Pulse 98   Temp 97.6 F (36.4 C)   Wt 162 lb 11.2 oz (73.8 kg)   SpO2 99%   BMI 29.76 kg/m    Subjective:    Patient ID: Vinton Dicus, male    DOB: December 28, 1965, 54 y.o.   MRN: [id number]  HPI: Fonzo Combest is a 54 y.o. male presenting on 10/31/2019 for Hyperlipidemia   HPI  Pt had a negative covid 19 screening questionnaire   Pt is 67yoM with dyslipidemia.   He has no history of htn.    bp at GI on 10/10/19 was 140/73  Pt had colonoscopy- polyp  He works Biomedical scientist  He walks for exercise- 30 minutes 1day/week.    He says he is feeling good and has no complaints.      Relevant past medical, surgical, family and social history reviewed and updated as indicated. Interim medical history since our last visit reviewed. Allergies and medications reviewed and updated.   Current Outpatient Medications:  .  atorvastatin (LIPITOR) 20 MG tablet, TOME UNA TABLETA POR VIA ORAL TODOS LOS DIAS (Patient taking differently: Take 20 mg by mouth daily. TOME UNA TABLETA POR VIA ORAL TODOS LOS DIAS), Disp: 30 tablet, Rfl: 6 .  Fiber POWD, Take 1 Dose by mouth daily as needed (constipation)., Disp: , Rfl:  .  Omega-3 Fatty Acids (FISH OIL PO), Take 1-2 capsules by mouth daily. , Disp: , Rfl:     Review of Systems  Per HPI unless specifically indicated above     Objective:    BP 140/76   Pulse 98   Temp 97.6 F (36.4 C)   Wt 162 lb 11.2 oz (73.8 kg)   SpO2 99%   BMI 29.76 kg/m   Wt Readings from Last 3 Encounters:  10/31/19 162 lb 11.2 oz (73.8 kg)  10/10/19 160 lb (72.6 kg)  08/03/18 155 lb (70.3 kg)    Physical Exam Vitals reviewed.  Constitutional:      General: He is not in acute distress.    Appearance: Normal appearance. He is well-developed. He is not ill-appearing.  HENT:     Head: Normocephalic and atraumatic.  Cardiovascular:     Rate and Rhythm: Normal rate and regular rhythm.  Pulmonary:     Effort: Pulmonary effort is normal.   Breath sounds: Normal breath sounds. No wheezing.  Abdominal:     General: Bowel sounds are normal.     Palpations: Abdomen is soft.     Tenderness: There is no abdominal tenderness.     Hernia: A hernia is present. Hernia is present in the umbilical area.     Comments: Small nontender umbilical hernia  Musculoskeletal:     Cervical back: Neck supple.     Right lower leg: No edema.     Left lower leg: No edema.  Lymphadenopathy:     Cervical: No cervical adenopathy.  Skin:    General: Skin is warm and dry.  Neurological:     Mental Status: He is alert and oriented to person, place, and time.  Psychiatric:        Attention and Perception: Attention normal.        Mood and Affect: Mood normal.        Speech: Speech normal.        Behavior: Behavior normal. Behavior is cooperative.      Umbilical hernia  Results for orders placed or performed during the hospital encounter of 10/27/19  PSA  Result Value Ref Range   Prostatic Specific Antigen 0.34 0.00 - 4.00 ng/mL  Lipid panel  Result Value Ref Range   Cholesterol 225 (H) 0 - 200 mg/dL   Triglycerides 203 (H) <150 mg/dL   HDL 43 >40 mg/dL   Total CHOL/HDL Ratio 5.2 RATIO   VLDL 41 (H) 0 - 40 mg/dL   LDL Cholesterol 141 (H) 0 - 99 mg/dL  Comprehensive metabolic panel  Result Value Ref Range   Sodium 139 135 - 145 mmol/L   Potassium 4.5 3.5 - 5.1 mmol/L   Chloride 106 98 - 111 mmol/L   CO2 26 22 - 32 mmol/L   Glucose, Bld 105 (H) 70 - 99 mg/dL   BUN 13 6 - 20 mg/dL   Creatinine, Ser 0.94 0.61 - 1.24 mg/dL   Calcium 9.0 8.9 - 10.3 mg/dL   Total Protein 7.1 6.5 - 8.1 g/dL   Albumin 4.2 3.5 - 5.0 g/dL   AST 25 15 - 41 U/L   ALT 32 0 - 44 U/L   Alkaline Phosphatase 68 38 - 126 U/L   Total Bilirubin 0.8 0.3 - 1.2 mg/dL   GFR calc non Af Amer >60 >60 mL/min   GFR calc Af Amer >60 >60 mL/min   Anion gap 7 5 - 15      Assessment & Plan:   Encounter Diagnoses  Name Primary?  . Hyperlipidemia, unspecified  hyperlipidemia type Yes  . Essential hypertension   . Not proficient in [place] language      -Reviewed labs with pt -Start lisinopril -Discussed increasing cholesterol medication but he wants to work on diet and exercise.  Pt given reading information on cholesterol and lowfat diet -Gave bp machine to monitor bp -pt to follow up 1 month virtual appointment.  He is to contact office sooner prn

## 2019-11-28 ENCOUNTER — Encounter: Payer: Self-pay | Admitting: Physician Assistant

## 2019-11-28 ENCOUNTER — Ambulatory Visit: Payer: Self-pay | Admitting: Physician Assistant

## 2019-11-28 VITALS — BP 122/77 | HR 47

## 2019-11-28 DIAGNOSIS — E785 Hyperlipidemia, unspecified: Secondary | ICD-10-CM

## 2019-11-28 DIAGNOSIS — I10 Essential (primary) hypertension: Secondary | ICD-10-CM

## 2019-11-28 DIAGNOSIS — Z789 Other specified health status: Secondary | ICD-10-CM

## 2019-11-28 NOTE — Progress Notes (Signed)
   BP 122/77   Pulse (!) 47    Subjective:    Patient ID: Bobby Newton, male    DOB: 01/08/1966, 54 y.o.   MRN: [id number]  HPI: Bobby Newton is a 54 y.o. male presenting on 11/28/2019 for No chief complaint on file.   HPI  This is a telemedicine appointment through [employer] due to coronavirus pandemic.    I connected with  Bobby Newton on 11/28/19 by a video enabled telemedicine application and verified that I am speaking with the correct person using two identifiers.   I discussed the limitations of evaluation and management by telemedicine. The patient expressed understanding and agreed to proceed.  Pt is at home.  Provider and translator are at office.     Pt is 72yoM with dyslipidemia and htn.  He says he is doing well.  He is working (but not today due to rain).  He has no CP or sob.  He monitors his BP at home and reading is noted.    Relevant past medical, surgical, family and social history reviewed and updated as indicated. Interim medical history since our last visit reviewed. Allergies and medications reviewed and updated.   Current Outpatient Medications:  .  atorvastatin (LIPITOR) 20 MG tablet, TOME UNA TABLETA POR VIA ORAL TODOS LOS DIAS, Disp: 30 tablet, Rfl: 6 .  lisinopril (ZESTRIL) 10 MG tablet, 1 po qd.  Tome una tableta por boca diaria, Disp: 30 tablet, Rfl: 4 .  Omega-3 Fatty Acids (FISH OIL PO), Take 1-2 capsules by mouth daily. , Disp: , Rfl:  .  Fiber POWD, Take 1 Dose by mouth daily as needed (constipation)., Disp: , Rfl:      Review of Systems  Per HPI unless specifically indicated above     Objective:    BP 122/77   Pulse (!) 47   Wt Readings from Last 3 Encounters:  10/31/19 162 lb 11.2 oz (73.8 kg)  10/10/19 160 lb (72.6 kg)  08/03/18 155 lb (70.3 kg)    Physical Exam Vitals reviewed.  Constitutional:      General: He is not in acute distress.    Appearance: Normal appearance. He is not ill-appearing.  HENT:     Head:  Normocephalic and atraumatic.  Pulmonary:     Effort: No respiratory distress.  Neurological:     Mental Status: He is alert and oriented to person, place, and time.  Psychiatric:        Attention and Perception: Attention normal.        Speech: Speech normal.        Behavior: Behavior normal. Behavior is cooperative.            Assessment & Plan:   Encounter Diagnoses  Name Primary?  . Essential hypertension Yes  . Hyperlipidemia, unspecified hyperlipidemia type   . Not proficient in [place] language       -pt to Continue current medicastions.   -he is to follow up in 2 months.  He is to contact office sooner prn

## 2020-01-27 ENCOUNTER — Other Ambulatory Visit (HOSPITAL_COMMUNITY)
Admission: RE | Admit: 2020-01-27 | Discharge: 2020-01-27 | Disposition: A | Discharge status: Home / Self Care | Payer: Self-pay | Source: Ambulatory Visit | Attending: Physician Assistant | Admitting: Physician Assistant

## 2020-01-27 DIAGNOSIS — E785 Hyperlipidemia, unspecified: Secondary | ICD-10-CM | POA: Insufficient documentation

## 2020-01-27 DIAGNOSIS — I10 Essential (primary) hypertension: Secondary | ICD-10-CM | POA: Insufficient documentation

## 2020-01-27 LAB — COMPREHENSIVE METABOLIC PANEL
ALT: 33 U/L (ref 0–44)
AST: 29 U/L (ref 15–41)
Albumin: 4.1 g/dL (ref 3.5–5.0)
Alkaline Phosphatase: 68 U/L (ref 38–126)
Anion gap: 10 (ref 5–15)
BUN: 18 mg/dL (ref 6–20)
CO2: 22 mmol/L (ref 22–32)
Calcium: 8.5 mg/dL — ABNORMAL LOW (ref 8.9–10.3)
Chloride: 105 mmol/L (ref 98–111)
Creatinine, Ser: 0.92 mg/dL (ref 0.61–1.24)
GFR calc Af Amer: >60 mL/min (ref >60)
GFR calc non Af Amer: >60 mL/min (ref >60)
Glucose, Bld: 110 mg/dL — ABNORMAL HIGH (ref 70–99)
Potassium: 3.9 mmol/L (ref 3.5–5.1)
Sodium: 137 mmol/L (ref 135–145)
Total Bilirubin: 0.9 mg/dL (ref 0.3–1.2)
Total Protein: 6.3 g/dL — ABNORMAL LOW (ref 6.5–8.1)

## 2020-01-27 LAB — LIPID PANEL
Cholesterol: 163 mg/dL (ref 0–200)
HDL: 47 mg/dL (ref >40)
LDL Cholesterol: 89 mg/dL (ref 0–99)
Total CHOL/HDL Ratio: 3.5 RATIO
Triglycerides: 135 mg/dL (ref <150)
VLDL: 27 mg/dL (ref 0–40)

## 2020-01-30 ENCOUNTER — Other Ambulatory Visit: Payer: Self-pay

## 2020-01-30 ENCOUNTER — Encounter: Payer: Self-pay | Admitting: Physician Assistant

## 2020-01-30 ENCOUNTER — Ambulatory Visit: Payer: Self-pay | Admitting: Physician Assistant

## 2020-01-30 VITALS — BP 100/60 | HR 59 | Temp 98.1°F | Wt 159.2 lb

## 2020-01-30 DIAGNOSIS — Z2821 Immunization not carried out because of patient refusal: Secondary | ICD-10-CM

## 2020-01-30 DIAGNOSIS — E785 Hyperlipidemia, unspecified: Secondary | ICD-10-CM

## 2020-01-30 DIAGNOSIS — I10 Essential (primary) hypertension: Secondary | ICD-10-CM

## 2020-01-30 DIAGNOSIS — Z789 Other specified health status: Secondary | ICD-10-CM

## 2020-01-30 MED ORDER — LISINOPRIL 10 MG PO TABS: ORAL_TABLET | ORAL | 6 refills | Status: DC

## 2020-01-30 MED ORDER — ATORVASTATIN CALCIUM 20 MG PO TABS: ORAL_TABLET | ORAL | 6 refills | Status: DC

## 2020-01-30 NOTE — Progress Notes (Signed)
BP 100/60   Pulse (!) 59   Temp 98.1 F (36.7 C)   Wt 159 lb 3.2 oz (72.2 kg)   SpO2 97%   BMI 29.12 kg/m    Subjective:    Patient ID: Bobby Newton, male    DOB: [birth year], 54 y.o.   MRN: [id number]  HPI: Bobby Newton is a 54 y.o. male presenting on 01/30/2020 for Hypertension and Hyperlipidemia   HPI  Pt had a negative covid 19 screening questionnaire.   Pt is a 80yoM with dysliidemia and htn.  He says he is doing well today and he has no complaints.     Relevant past medical, surgical, family and social history reviewed and updated as indicated. Interim medical history since our last visit reviewed. Allergies and medications reviewed and updated.   Current Outpatient Medications:  .  atorvastatin (LIPITOR) 20 MG tablet, TOME UNA TABLETA POR VIA ORAL TODOS LOS DIAS, Disp: 30 tablet, Rfl: 6 .  Fiber POWD, Take 1 Dose by mouth daily as needed (constipation)., Disp: , Rfl:  .  lisinopril (ZESTRIL) 10 MG tablet, 1 po qd.  Tome una tableta por boca diaria, Disp: 30 tablet, Rfl: 4 .  Omega-3 Fatty Acids (FISH OIL PO), Take 1-2 capsules by mouth daily. , Disp: , Rfl:     Review of Systems  Per HPI unless specifically indicated above     Objective:    BP 100/60   Pulse (!) 59   Temp 98.1 F (36.7 C)   Wt 159 lb 3.2 oz (72.2 kg)   SpO2 97%   BMI 29.12 kg/m   Wt Readings from Last 3 Encounters:  01/30/20 159 lb 3.2 oz (72.2 kg)  10/31/19 162 lb 11.2 oz (73.8 kg)  10/10/19 160 lb (72.6 kg)    Physical Exam Vitals reviewed.  Constitutional:      General: He is not in acute distress.    Appearance: He is well-developed. He is not ill-appearing.  HENT:     Head: Normocephalic and atraumatic.  Cardiovascular:     Rate and Rhythm: Normal rate and regular rhythm.  Pulmonary:     Effort: Pulmonary effort is normal.     Breath sounds: Normal breath sounds. No wheezing.  Abdominal:     General: Bowel sounds are normal.     Palpations: Abdomen is soft.   Tenderness: There is no abdominal tenderness.     Hernia: A hernia is present. Hernia is present in the umbilical area.     Comments: Small, Easily reducible, nontender  Musculoskeletal:     Cervical back: Neck supple.     Right lower leg: No edema.     Left lower leg: No edema.  Lymphadenopathy:     Cervical: No cervical adenopathy.  Skin:    General: Skin is warm and dry.  Neurological:     Mental Status: He is alert and oriented to person, place, and time.     Gait: Gait is intact. Gait normal.  Psychiatric:        Attention and Perception: Attention normal.        Speech: Speech normal.        Behavior: Behavior normal. Behavior is cooperative.     Results for orders placed or performed during the hospital encounter of 01/27/20  Lipid panel  Result Value Ref Range   Cholesterol 163 0 - 200 mg/dL   Triglycerides 135 <150 mg/dL   HDL 47 >40 mg/dL   Total CHOL/HDL Ratio 3.5  RATIO   VLDL 27 0 - 40 mg/dL   LDL Cholesterol 89 0 - 99 mg/dL  Comprehensive metabolic panel  Result Value Ref Range   Sodium 137 135 - 145 mmol/L   Potassium 3.9 3.5 - 5.1 mmol/L   Chloride 105 98 - 111 mmol/L   CO2 22 22 - 32 mmol/L   Glucose, Bld 110 (H) 70 - 99 mg/dL   BUN 18 6 - 20 mg/dL   Creatinine, Ser 0.92 0.61 - 1.24 mg/dL   Calcium 8.5 (L) 8.9 - 10.3 mg/dL   Total Protein 6.3 (L) 6.5 - 8.1 g/dL   Albumin 4.1 3.5 - 5.0 g/dL   AST 29 15 - 41 U/L   ALT 33 0 - 44 U/L   Alkaline Phosphatase 68 38 - 126 U/L   Total Bilirubin 0.9 0.3 - 1.2 mg/dL   GFR calc non Af Amer >60 >60 mL/min   GFR calc Af Amer >60 >60 mL/min   Anion gap 10 5 - 15      Assessment & Plan:    Encounter Diagnoses  Name Primary?  . Essential hypertension Yes  . Hyperlipidemia, unspecified hyperlipidemia type   . Not proficient in [place] language   . COVID-19 virus vaccination declined      -Reviewed labs with pt -pt to continue current medications -discussed covid vaccination and He declined covid  vaccination -pt to follow up 6 months.  He is to contact office sooner prn

## 2020-02-09 ENCOUNTER — Other Ambulatory Visit: Payer: Self-pay

## 2020-02-09 ENCOUNTER — Telehealth: Payer: Self-pay | Admitting: Student

## 2020-02-09 NOTE — Telephone Encounter (Addendum)
Pt called and left vm 02-09-20 requesting call back.  LPN called pt on [phone] and spoke with pt. Pt c/o HA, fever, cough, body aches which started on 01-31-20. Pt states he did a rapid COVID test 2 days ago (02-07-20) at [facility] and tested positive.   instructions given to pt: " remain in self-quarantine until they meet the "Non-Test Criteria for Ending Self-Isolation". Non-Test Criteria for Ending Self-Isolation All persons with fever and respiratory symptoms should isolate themselves until ALL conditions listed below are met: " at least 10 days since symptoms onset " AND 3 consecutive days fever free without antipyretics (acetaminophen [Tylenol] or ibuprofen [Advil]) " AND improvement in respiratory symptoms " If the patient develops respiratory issues/distress, seek medical care in the Emergency Department, call 911, reports symptoms and report COVID-19 positive test. Patient Instructions " continue to utilize over-the-counter medications for fever (ibuprofen and/or Tylenol) and cough (cough medicine and/or sore throat lozenges). " wear a mask around people and follow good infection prevention techniques. " Patient to should only leave home to seek medical care. " send family for food, prescriptions or medicines; or use delivery service.  " If the patient must leave the home, they MUST wear a mask in public. " limit contact with immediate family members or caregivers in the home, and use mask, social distancing, and handwashing to decrease risk to patients. o Please continue good preventive care measures, including frequent hand washing, avoid touching your face, cover coughs/sneezes with tissue or into elbow, stay out of crowds and keep a 6-foot distance from others.   " patient and family to clean hard surfaces touched by patient frequently with household cleaning products.  Pt verbalized understanding.

## 2020-02-10 ENCOUNTER — Emergency Department (HOSPITAL_COMMUNITY): Payer: HRSA Program

## 2020-02-10 ENCOUNTER — Other Ambulatory Visit: Payer: Self-pay

## 2020-02-10 ENCOUNTER — Encounter (HOSPITAL_COMMUNITY): Payer: Self-pay | Admitting: *Deleted

## 2020-02-10 ENCOUNTER — Emergency Department (HOSPITAL_COMMUNITY)
Admission: EM | Admit: 2020-02-10 | Discharge: 2020-02-10 | Disposition: A | Discharge status: Home / Self Care | Payer: HRSA Program | Attending: Emergency Medicine | Admitting: Emergency Medicine

## 2020-02-10 DIAGNOSIS — Z79899 Other long term (current) drug therapy: Secondary | ICD-10-CM | POA: Diagnosis not present

## 2020-02-10 DIAGNOSIS — I10 Essential (primary) hypertension: Secondary | ICD-10-CM | POA: Diagnosis not present

## 2020-02-10 DIAGNOSIS — U071 COVID-19: Secondary | ICD-10-CM | POA: Insufficient documentation

## 2020-02-10 DIAGNOSIS — R0602 Shortness of breath: Secondary | ICD-10-CM | POA: Diagnosis present

## 2020-02-10 LAB — BASIC METABOLIC PANEL
Anion gap: 10 (ref 5–15)
BUN: 14 mg/dL (ref 6–20)
CO2: 21 mmol/L — ABNORMAL LOW (ref 22–32)
Calcium: 7.7 mg/dL — ABNORMAL LOW (ref 8.9–10.3)
Chloride: 96 mmol/L — ABNORMAL LOW (ref 98–111)
Creatinine, Ser: 1.01 mg/dL (ref 0.61–1.24)
GFR calc Af Amer: >60 mL/min (ref >60)
GFR calc non Af Amer: >60 mL/min (ref >60)
Glucose, Bld: 106 mg/dL — ABNORMAL HIGH (ref 70–99)
Potassium: 4.6 mmol/L (ref 3.5–5.1)
Sodium: 127 mmol/L — ABNORMAL LOW (ref 135–145)

## 2020-02-10 LAB — CBC WITH DIFFERENTIAL/PLATELET
Abs Immature Granulocytes: 0.05 10*3/uL (ref 0.00–0.07)
Basophils Absolute: 0 10*3/uL (ref 0.0–0.1)
Basophils Relative: 0 %
Eosinophils Absolute: 0 10*3/uL (ref 0.0–0.5)
Eosinophils Relative: 0 %
HCT: 41.6 % (ref 39.0–52.0)
Hemoglobin: 14.1 g/dL (ref 13.0–17.0)
Immature Granulocytes: 1 %
Lymphocytes Relative: 9 %
Lymphs Abs: 0.7 10*3/uL (ref 0.7–4.0)
MCH: 30.1 pg (ref 26.0–34.0)
MCHC: 33.9 g/dL (ref 30.0–36.0)
MCV: 88.9 fL (ref 80.0–100.0)
Monocytes Absolute: 0.2 10*3/uL (ref 0.1–1.0)
Monocytes Relative: 2 %
Neutro Abs: 7 10*3/uL (ref 1.7–7.7)
Neutrophils Relative %: 88 %
Platelets: 129 10*3/uL — ABNORMAL LOW (ref 150–400)
RBC: 4.68 MIL/uL (ref 4.22–5.81)
RDW: 12.6 % (ref 11.5–15.5)
WBC: 7.9 10*3/uL (ref 4.0–10.5)
nRBC: 0 % (ref 0.0–0.2)

## 2020-02-10 LAB — SARS CORONAVIRUS 2 BY RT PCR (HOSPITAL ORDER, PERFORMED IN ~~LOC~~ HOSPITAL LAB): SARS Coronavirus 2: POSITIVE — AB

## 2020-02-10 MED ORDER — AEROCHAMBER Z-STAT PLUS/MEDIUM MISC
1.0000 | Freq: Once | Status: DC
  Filled 2020-02-10: qty 1

## 2020-02-10 MED ORDER — BENZONATATE 100 MG PO CAPS
200.0000 mg | ORAL_CAPSULE | Freq: Three times a day (TID) | ORAL | 0 refills | Status: DC | PRN
Start: 2020-02-10 — End: 2020-08-01

## 2020-02-10 MED ORDER — BENZONATATE 100 MG PO CAPS
200.0000 mg | ORAL_CAPSULE | Freq: Once | ORAL | Status: AC
  Administered 2020-02-10: 200 mg via ORAL
  Filled 2020-02-10: qty 2

## 2020-02-10 MED ORDER — IBUPROFEN 800 MG PO TABS
800.0000 mg | ORAL_TABLET | Freq: Once | ORAL | Status: AC
  Administered 2020-02-10: 800 mg via ORAL

## 2020-02-10 MED ORDER — SODIUM CHLORIDE 0.9 % IV BOLUS
1000.0000 mL | Freq: Once | INTRAVENOUS | Status: AC
  Administered 2020-02-10: 1000 mL via INTRAVENOUS

## 2020-02-10 MED ORDER — ALBUTEROL SULFATE HFA 108 (90 BASE) MCG/ACT IN AERS
2.0000 | INHALATION_SPRAY | RESPIRATORY_TRACT | Status: DC | PRN
  Administered 2020-02-10: 2 via RESPIRATORY_TRACT
  Filled 2020-02-10: qty 6.7

## 2020-02-10 NOTE — Discharge Instructions (Addendum)
You will need to stay at home and home quarantine for a full 10 days if your symptoms are resolved by that time.  Rest and make sure you are drinking plenty of fluids.  Use the albuterol inhaler given - 2 puffs every 4 hours if you are coughing or short of breath.  Finish the antibiotic you were prescribed by your MD.  Dennis Bast may take the tessalon for cough symptoms.  You have been referred our monoclonal infusion clinic - you may be eligible to receive this treatment which can help your symptoms improve quicker.  You may get a phone call from them to arrange this.   Otherwise return here for any worsening symptoms such as increased weakness or shortness of breath.

## 2020-02-10 NOTE — ED Triage Notes (Signed)
Short of breath, feels weak

## 2020-02-11 NOTE — ED Provider Notes (Signed)
[name] EMERGENCY DEPARTMENT Provider Note   CSN: [id number] Arrival date & time: 02/10/20  1352     History Chief Complaint  Patient presents with  . Shortness of Breath    Bobby Newton is a 54 y.o. male with a history of HTN and hypercholesterolemia presenting with generalized fatigue and increased shortness of breath along with subjective.  He has had a non productive cough and a generalized headache, both keeping him from sleeping.  He has taken ibuprofen 200 mg without improvement in symptoms.  He denies chest pain, sob, fevers but does endorse chills, denies n/v/d or abdominal pain.  He was diagnosed with Covid 19 three days ago after been exposed to a coworker last week.  He was prescribed a zpack, has completed day 4.   The history is provided by the patient. The history is limited by a language barrier. A language interpreter was used.       Past Medical History:  Diagnosis Date  . Hypercholesterolemia   . Hypertension     Patient Active Problem List   Diagnosis Date Noted  . Hyperlipidemia 01/30/2016  . Esophageal reflux 01/30/2016  . Rectal bleeding 05/22/2014  . 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 HEMORRHOIDS, INTERNAL, WITH BLEEDING 01/10/2009  . UNSPECIFIED INTESTINAL HELMINTHIASIS 01/09/2009  . URINARY URGENCY 01/09/2009  . Personal history of other diseases of digestive system 01/09/2009    Past Surgical History:  Procedure Laterality Date  . COLONOSCOPY  2009   Dr. Oneida Alar: multiple tape worms in small intestine and colon. simple adenoma. Surveillance in 5 years  . COLONOSCOPY N/A 06/07/2014   Procedure: COLONOSCOPY;  Surgeon: Danie Binder, MD;  Location: [facility];  Service: Endoscopy;  Laterality: N/A;  8:30  . COLONOSCOPY N/A 10/10/2019   Procedure: COLONOSCOPY;  Surgeon: Danie Binder, MD;  Location: [facility];  Service: Endoscopy;  Laterality: N/A;  2:00  . POLYPECTOMY  10/10/2019   Procedure: POLYPECTOMY;  Surgeon: Danie Binder, MD;  Location: [facility];  Service: Endoscopy;;       Family History  Problem Relation Age of Onset  . Colon cancer Neg Hx     Social History   Tobacco Use  . Smoking status: Never Smoker  . Smokeless tobacco: Never Used  Substance Use Topics  . Alcohol use: No  . Drug use: No    Home Medications Prior to Admission medications   Medication Sig Start Date End Date Taking? Authorizing Provider  atorvastatin (LIPITOR) 20 MG tablet TOME UNA TABLETA POR VIA ORAL TODOS LOS DIAS 01/30/20   Soyla Dryer, PA-C  azithromycin (ZITHROMAX) 250 MG tablet Take 1-2 tablets by mouth as directed. Take 2 tablets by mouth on day 1, then take 1 tablet by mouth on days 2-5 until completed 02/06/20   [provider]  benzonatate (TESSALON) 100 MG capsule Take 2 capsules (200 mg total) by mouth 3 (three) times daily as needed for cough. 02/10/20   Evalee Jefferson, PA-C  diphenoxylate-atropine (LOMOTIL) 2.5-0.025 MG tablet Take 1-2 tablets by mouth as directed. Take 2 tablets by mouth after first loose stool, then take 1 tablet by mouth for each loose stool after - do not exceed 8 tablets in 24 hours 02/06/20   [provider]  Fiber POWD Take 1 Dose by mouth daily as needed (constipation).    [provider]  lisinopril (ZESTRIL) 10 MG tablet 1 po qd.  Tome una tableta por boca diaria 01/30/20   Soyla Dryer,  PA-C  Omega-3 Fatty Acids (FISH OIL PO) Take 1-2 capsules by mouth daily.     [provider]    Allergies    Patient has no known allergies.  Review of Systems   Review of Systems  Constitutional: Positive for chills and fatigue. Negative for fever.  HENT: Negative for congestion and sore throat.   Eyes: Negative.   Respiratory: Positive for cough and shortness of breath. Negative for chest tightness.   Cardiovascular: Negative for chest pain.  Gastrointestinal: Negative for abdominal pain, diarrhea, nausea and vomiting.  Genitourinary: Negative.     Musculoskeletal: Negative for arthralgias, joint swelling, neck pain and neck stiffness.  Skin: Negative.  Negative for rash and wound.  Neurological: Positive for headaches. Negative for dizziness, weakness, light-headedness and numbness.  Psychiatric/Behavioral: Negative.     Physical Exam Updated Vital Signs BP 100/62 (BP Location: Right Arm)   Pulse 60   Temp 99.2 F (37.3 C) (Oral)   Resp 18   SpO2 94%   Physical Exam Vitals and nursing note reviewed.  Constitutional:      Appearance: He is well-developed.  HENT:     Head: Normocephalic and atraumatic.  Eyes:     Conjunctiva/sclera: Conjunctivae normal.  Cardiovascular:     Rate and Rhythm: Normal rate and regular rhythm.     Heart sounds: Normal heart sounds.  Pulmonary:     Effort: Pulmonary effort is normal.     Breath sounds: Rhonchi present. No decreased breath sounds, wheezing or rales.  Abdominal:     General: Bowel sounds are normal.     Palpations: Abdomen is soft.     Tenderness: There is no abdominal tenderness.  Musculoskeletal:        General: Normal range of motion.     Cervical back: Normal range of motion.  Skin:    General: Skin is warm and dry.     Capillary Refill: Capillary refill takes less than 2 seconds.  Neurological:     General: No focal deficit present.     Mental Status: He is alert.     ED Results / Procedures / Treatments   Labs (all labs ordered are listed, but only abnormal results are displayed) Labs Reviewed  SARS CORONAVIRUS 2 BY RT PCR (HOSPITAL ORDER, [place] [facility]) - Abnormal; Notable for the following components:      Result Value   SARS Coronavirus 2 POSITIVE (*)    All other components within normal limits  CBC WITH DIFFERENTIAL/PLATELET - Abnormal; Notable for the following components:   Platelets 129 (*)    All other components within normal limits  BASIC METABOLIC PANEL - Abnormal; Notable for the following components:   Sodium 127 (*)     Chloride 96 (*)    CO2 21 (*)    Glucose, Bld 106 (*)    Calcium 7.7 (*)    All other components within normal limits    EKG None  Radiology DG Chest Portable 1 View  Result Date: 02/10/2020 CLINICAL DATA:  54 year old male with shortness of breath. Negative for COVID-19 in January, being retested today. EXAM: PORTABLE CHEST 1 VIEW COMPARISON:  None. FINDINGS: Portable AP upright view at 1454 hours. Low lung volumes, probably extension waiting cardiac size. Other mediastinal contours are within normal limits. Visualized tracheal air column is within normal limits. Widespread bilateral coarse and indistinct pulmonary opacity, relatively sparing the lung apices. No pneumothorax or pleural effusion identified. Negative visible bowel gas pattern. No  acute osseous abnormality identified. IMPRESSION: Widespread bilateral indistinct pulmonary opacity. Favor acute viral/atypical respiratory infection over asymmetric pulmonary edema given the apparent lack of pleural fluid. Electronically Signed   By: Genevie Ann M.D.   On: 02/10/2020 15:22    Procedures Procedures (including critical care time)  Medications Ordered in ED Medications  sodium chloride 0.9 % bolus 1,000 mL (0 mLs Intravenous Stopped 02/10/20 1653)  benzonatate (TESSALON) capsule 200 mg (200 mg Oral Given 02/10/20 1519)  ibuprofen (ADVIL) tablet 800 mg (800 mg Oral Given 02/10/20 1520)    ED Course  I have reviewed the triage vital signs and the nursing notes.  Pertinent labs & imaging results that were available during my care of the patient were reviewed by me and considered in my medical decision making (see chart for details).    MDM Rules/Calculators/A&P                      Labs and imaging reviewed and discussed with pt.  He was given NS IV fluids, after which he felt improved, this should also help improve his hyponatremia.  He was ambulated in his room with pulse and had no increased sob or oxygen desaturation, remaining  at 96% or above. He had been given an albuterol mdi with spacer for home use as needed for any return of sob, encouraged rest, increased fluid intake, increased ibuprofen dosing if needed for headache reduction.  Tessalon for cough suppression. Return precautions outlined.  Also given instructions for home quarantine for 10 days from onset of sx per CDC guidelines.  Nayan Dozois was evaluated in Emergency Department on 02/11/2020 for the symptoms described in the history of present illness. He was evaluated in the context of the global COVID-19 pandemic, which necessitated consideration that the patient might be at risk for infection with the SARS-CoV-2 virus that causes COVID-19. Institutional protocols and algorithms that pertain to the evaluation of patients at risk for COVID-19 are in a state of rapid change based on information released by regulatory bodies including the CDC and federal and state organizations. These policies and algorithms were followed during the patient's care in the ED.   Final Clinical Impression(s) / ED Diagnoses Final diagnoses:  [id number]    Rx / DC Orders ED Discharge Orders         Ordered    benzonatate (TESSALON) 100 MG capsule  3 times daily PRN     02/10/20 1646           Evalee Jefferson, PA-C 02/11/20 1611    Long, Wonda Olds, MD 02/13/20 [phone]

## 2020-07-27 ENCOUNTER — Other Ambulatory Visit (HOSPITAL_COMMUNITY)
Admission: RE | Admit: 2020-07-27 | Discharge: 2020-07-27 | Disposition: A | Discharge status: Home / Self Care | Payer: Self-pay | Source: Ambulatory Visit | Attending: Physician Assistant | Admitting: Physician Assistant

## 2020-07-27 ENCOUNTER — Other Ambulatory Visit: Payer: Self-pay

## 2020-07-27 DIAGNOSIS — I10 Essential (primary) hypertension: Secondary | ICD-10-CM | POA: Insufficient documentation

## 2020-07-27 DIAGNOSIS — E785 Hyperlipidemia, unspecified: Secondary | ICD-10-CM | POA: Insufficient documentation

## 2020-07-27 LAB — COMPREHENSIVE METABOLIC PANEL
ALT: 33 U/L (ref 0–44)
AST: 29 U/L (ref 15–41)
Albumin: 4.2 g/dL (ref 3.5–5.0)
Alkaline Phosphatase: 65 U/L (ref 38–126)
Anion gap: 8 (ref 5–15)
BUN: 17 mg/dL (ref 6–20)
CO2: 24 mmol/L (ref 22–32)
Calcium: 8.9 mg/dL (ref 8.9–10.3)
Chloride: 104 mmol/L (ref 98–111)
Creatinine, Ser: 0.98 mg/dL (ref 0.61–1.24)
GFR, Estimated: >60 mL/min (ref >60)
Glucose, Bld: 110 mg/dL — ABNORMAL HIGH (ref 70–99)
Potassium: 4.5 mmol/L (ref 3.5–5.1)
Sodium: 136 mmol/L (ref 135–145)
Total Bilirubin: 1 mg/dL (ref 0.3–1.2)
Total Protein: 7.2 g/dL (ref 6.5–8.1)

## 2020-07-27 LAB — LIPID PANEL
Cholesterol: 185 mg/dL (ref 0–200)
HDL: 51 mg/dL (ref >40)
LDL Cholesterol: 110 mg/dL — ABNORMAL HIGH (ref 0–99)
Total CHOL/HDL Ratio: 3.6 RATIO
Triglycerides: 122 mg/dL (ref <150)
VLDL: 24 mg/dL (ref 0–40)

## 2020-08-01 ENCOUNTER — Encounter: Payer: Self-pay | Admitting: Physician Assistant

## 2020-08-01 ENCOUNTER — Ambulatory Visit: Payer: Self-pay | Admitting: Physician Assistant

## 2020-08-01 VITALS — BP 120/78 | HR 52 | Temp 97.7°F | Ht 63.25 in | Wt 157.0 lb

## 2020-08-01 DIAGNOSIS — Z789 Other specified health status: Secondary | ICD-10-CM

## 2020-08-01 DIAGNOSIS — K429 Umbilical hernia without obstruction or gangrene: Secondary | ICD-10-CM

## 2020-08-01 DIAGNOSIS — E785 Hyperlipidemia, unspecified: Secondary | ICD-10-CM

## 2020-08-01 MED ORDER — ATORVASTATIN CALCIUM 20 MG PO TABS: ORAL_TABLET | ORAL | 6 refills | Status: DC

## 2020-08-01 NOTE — Patient Instructions (Signed)
Hernia, en adultos Hernia, Adult     Una hernia es la protrusin de un rgano o un tejido a travs de un punto dbil en los msculos del abdomen (pared abdominal). Las hernias se desarrollan con ms frecuencia cerca del ombligo o en la zona donde se unen las piernas con el abdomen inferior (ingle). Los tipos ms comunes de hernias incluyen:  Hernia incisional. Este tipo sobresale a travs de una cicatriz de una ciruga abdominal.  Hernia umbilical. Este tipo se desarrolla cerca del ombligo.  Hernia inguinal. Este tipo de hernia aparece en la ingle o en el escroto.  Hernia crural. Este tipo de hernia aparece por debajo de la ingle en la regin superior del muslo.  Hernia de hiato. Se produce cuando parte del estmago se desliza por arriba del msculo que separa el abdomen del trax (diafragma). Cules son las causas? Esta afeccin puede ser causada por lo siguiente:  Levantar peso excesivo.  [employer].  Hacer un gran esfuerzo para defecar. El estreimiento puede llevar a Materials engineer un gran esfuerzo.  Una incisin realizada durante una ciruga abdominal.  Un problema fsico presente desde el nacimiento (defecto congnito).  Tener exceso de [place] u obesidad.  Fumar.  Exceso de lquido en el abdomen.  Falta de descenso de los [employer]. Cules son los signos o sntomas? El sntoma principal es un bulto color piel y redondeado en la zona de la hernia. Sin embargo, el bulto no siempre es visible. Puede hacerse ms grande o ms visible al toser o Special educational needs teacher esfuerzo (como cuando se levanta un objeto pesado). Una hernia que se puede empujar hacia Product/process development scientist (es reducible) rara vez causa dolor. Una hernia que no se puede empujar Lexicographer (encarcelada) puede perder irrigacin sangunea (estrangularse). Una hernia que est encarcelada puede causar lo siguiente:  Dolor.  Cristy Hilts.  Nuseas y  vmitos.  Hinchazn.  Estreimiento. Cmo se diagnostica? La hernia puede diagnosticarse en funcin de lo siguiente:  Los sntomas y antecedentes mdicos.  Un examen fsico. El mdico puede pedirle que tosa o que se mueva de ciertas maneras para controlar si la hernia se hace visible.  Pruebas de diagnstico por imgenes, por ejemplo: ? Radiografas. ? Ecografa. ? Exploracin por tomografa computarizada (TC). Cmo se trata? Es posible que una hernia pequea e indolora no necesite tratamiento. Una hernia grande o dolorosa puede tratarse con [place]. Para tratar las hernias inguinales, se puede recurrir a [place] para Midwife. Las hernias estranguladas siempre se tratan con ciruga porque la falta de irrigacin sangunea al rgano o al tejido atrapados puede causar su muerte. La ciruga para tratar una hernia incluye volver a Dispensing optician bulto en su lugar y reparar la zona dbil del msculo o de la pared abdominal. Siga estas indicaciones en su casa: Actividad  No haga esfuerzos.  No levante ningn objeto que pese ms de 10libras (4,5kg) o el lmite de peso que le haya indicado el mdico hasta que l le diga que es seguro [place].  Al levantar objetos pesados, use los msculos de las piernas, no los de la espalda. Prevencin del estreimiento  Tome medidas para Contractor. El estreimiento obliga a Optometrist esfuerzos para Landscape architect, los cuales pueden agravar una hernia o causar la rotura de la reparacin. El mdico podra recomendarle que haga lo siguiente: ? Beba suficiente lquido para mantener la orina de color amarillo plido. ? Consuma  alimentos ricos en fibra, como frutas y verduras frescas, cereales integrales y frijoles. ? Limite el consumo de alimentos ricos en grasa y azcares procesados, como alimentos fritos o dulces. ? Tome medicamentos recetados o de venta libre para el estreimiento. Instrucciones  generales  Cuando tosa, hgalo con suavidad.  Puede intentar colocar la hernia en su lugar ejerciendo sobre esta una presin muy suave mientras est acostado. No intente forzar el bulto hacia adentro nuevamente si este no entra fcilmente.  Si tiene sobrepeso, trabaje con el mdico para adelgazar sin riegos.  No consuma ningn producto que contenga nicotina o tabaco, como cigarrillos y Psychologist, sport and exercise. Si necesita ayuda para dejar de fumar, consulte al [employer].  Si tiene programada la reparacin de una hernia, controle la hernia para detectar cualquier cambio en la forma, el tamao o el color. Informe al mdico sobre cualquier cambio o sntoma nuevo.  Tome los medicamentos de venta libre y los recetados solamente como se lo haya indicado el mdico.  Consulting civil engineer a todas las visitas de control como se lo haya indicado el mdico. Esto es importante. Comunquese con un mdico si:  Hay dolor, hinchazn o enrojecimiento alrededor de la hernia.  Presenta signos de estreimiento, como los siguientes: ? Defeca menos que lo normal durante una semana. ? Tiene dificultades para defecar. ? Tiene las heces secas y duras, o ms grandes que lo normal. Solicite ayuda de inmediato si:  Tiene fiebre.  Tiene un dolor abdominal que empeora.  Siente nuseas o vomita.  No puede colocar la hernia en su lugar ejerciendo sobre esta una presin muy suave mientras est acostado. No intente forzar el bulto hacia adentro nuevamente si este no entra fcilmente.  La hernia: ? Cambia de forma, tamao o color. ? Est dura al tacto o causa dolor a la palpacin. Estos sntomas pueden representar un problema grave que constituye Engineer, maintenance (IT). No espere hasta que los sntomas desaparezcan. Solicite atencin mdica de inmediato. Comunquese con el servicio de emergencias de su localidad (911 en los [place]). Resumen  Una hernia es la protrusin de un rgano o un tejido a travs de un punto dbil en los  msculos del abdomen (pared abdominal).  El sntoma principal es un bulto color piel y redondeado (protuberancia) en la zona de la hernia. Sin embargo, el bulto no siempre es visible. Puede hacerse ms grande o ms visible al toser o hace un esfuerzo (como cuando Engineer, agricultural).  Es posible que una hernia pequea e indolora no necesite tratamiento. Una hernia grande o dolorosa puede tratarse con [place].  La ciruga para tratar una hernia incluye volver a Dispensing optician bulto en su lugar y reparar la zona dbil del abdomen. Esta informacin no tiene Marine scientist el consejo del mdico. Asegrese de hacerle al mdico cualquier pregunta que tenga. Document Revised: 12/16/2017 Document Reviewed: 08/05/2017 Elsevier Patient Education  [place].

## 2020-08-01 NOTE — Progress Notes (Signed)
BP 120/78   Pulse (!) 52   Temp 97.7 F (36.5 C)   Ht 5' 3.25" (1.607 m)   Wt 157 lb (71.2 kg)   SpO2 99%   BMI 27.59 kg/m    Subjective:    Patient ID: Bobby Newton, male    DOB: 21-Sep-1966, 54 y.o.   MRN: [id number]  HPI: [name] is a 54 y.o. male presenting on 08/01/2020 for Hypertension and Hyperlipidemia   HPI   Pt had a negative covid 19 screening questionnaire.     Pt is 5yoM who is in today for routine follow up HTN and dyslipidemia.   Pt stopped his lisinopril because his bp was low when he got covid.   Pt says he has hernia and asks about getting surgery for it.  He says it doesn't hurt or bother him, he just wonders if he should have surgery.  Pt has not yet gotten the covid vaccination.  Pt says he has constipation because he usually only moves his bowels 2-3 times/day.         Relevant past medical, surgical, family and social history reviewed and updated as indicated. Interim medical history since our last visit reviewed. Allergies and medications reviewed and updated.   Current Outpatient Medications:  .  atorvastatin (LIPITOR) 20 MG tablet, TOME UNA TABLETA POR VIA ORAL TODOS LOS DIAS, Disp: 30 tablet, Rfl: 6 .  diphenoxylate-atropine (LOMOTIL) 2.5-0.025 MG tablet, Take 1-2 tablets by mouth as directed. Take 2 tablets by mouth after first loose stool, then take 1 tablet by mouth for each loose stool after - do not exceed 8 tablets in 24 hours, Disp: , Rfl:  .  Fiber POWD, Take 1 Dose by mouth daily as needed (constipation)., Disp: , Rfl:  .  Omega-3 Fatty Acids (FISH OIL PO), Take 1-2 capsules by mouth daily. , Disp: , Rfl:  .  lisinopril (ZESTRIL) 10 MG tablet, 1 po qd.  Tome una tableta por boca diaria (Patient not taking: Reported on 08/01/2020), Disp: 30 tablet, Rfl: 6   Review of Systems  Per HPI unless specifically indicated above     Objective:    BP 120/78   Pulse (!) 52   Temp 97.7 F (36.5 C)   Ht 5' 3.25" (1.607 m)    Wt 157 lb (71.2 kg)   SpO2 99%   BMI 27.59 kg/m   Wt Readings from Last 3 Encounters:  08/01/20 157 lb (71.2 kg)  01/30/20 159 lb 3.2 oz (72.2 kg)  10/31/19 162 lb 11.2 oz (73.8 kg)    Physical Exam Vitals reviewed.  Constitutional:      General: He is not in acute distress.    Appearance: He is well-developed. He is not ill-appearing.  HENT:     Head: Normocephalic and atraumatic.  Cardiovascular:     Rate and Rhythm: Normal rate and regular rhythm.  Pulmonary:     Effort: Pulmonary effort is normal.     Breath sounds: Normal breath sounds. No wheezing.  Abdominal:     General: Bowel sounds are normal.     Palpations: Abdomen is soft.     Tenderness: There is no abdominal tenderness.     Hernia: A hernia is present. Hernia is present in the umbilical area.     Comments: Small umbilical hernia nontender  Musculoskeletal:     Cervical back: Neck supple.     Right lower leg: No edema.     Left lower leg: No edema.  Lymphadenopathy:     Cervical: No cervical adenopathy.  Skin:    General: Skin is warm and dry.  Neurological:     Mental Status: He is alert and oriented to person, place, and time.  Psychiatric:        Behavior: Behavior normal.     Results for orders placed or performed during the hospital encounter of 07/27/20  Lipid panel  Result Value Ref Range   Cholesterol 185 0 - 200 mg/dL   Triglycerides 122 <150 mg/dL   HDL 51 >40 mg/dL   Total CHOL/HDL Ratio 3.6 RATIO   VLDL 24 0 - 40 mg/dL   LDL Cholesterol 110 (H) 0 - 99 mg/dL  Comprehensive metabolic panel  Result Value Ref Range   Sodium 136 135 - 145 mmol/L   Potassium 4.5 3.5 - 5.1 mmol/L   Chloride 104 98 - 111 mmol/L   CO2 24 22 - 32 mmol/L   Glucose, Bld 110 (H) 70 - 99 mg/dL   BUN 17 6 - 20 mg/dL   Creatinine, Ser 0.98 0.61 - 1.24 mg/dL   Calcium 8.9 8.9 - 10.3 mg/dL   Total Protein 7.2 6.5 - 8.1 g/dL   Albumin 4.2 3.5 - 5.0 g/dL   AST 29 15 - 41 U/L   ALT 33 0 - 44 U/L   Alkaline  Phosphatase 65 38 - 126 U/L   Total Bilirubin 1.0 0.3 - 1.2 mg/dL   GFR, Estimated >60 >60 mL/min   Anion gap 8 5 - 15      Assessment & Plan:    Encounter Diagnoses  Name Primary?  . Hyperlipidemia, unspecified hyperlipidemia type Yes  . Not proficient in [place] language   . Umbilical hernia without obstruction and without gangrene      -reviewed labs with pt  -he Will stay off lisinopril for now as BP is good off it. -he will Continue  lipitor for his cholesterol -pt is educated and encouraged to get the covid vaccination -discussed with pt that his BMs are normal.  He is likely used to moving them more as he was used to having more when he lived in [place].  Pt states understanind -discussed with pt that surgery is not usual for a small hernia that is not causing any problems.  Discussed with pt that maintaining his weight is a good idea as extra weight would put more pressure on it and could expand the hernia -pt is educated and encouraged to get covid vaccination -pt to follow up  4 months,  He is to contact office sooner prn

## 2020-11-14 ENCOUNTER — Other Ambulatory Visit: Payer: Self-pay | Admitting: Physician Assistant

## 2020-11-14 DIAGNOSIS — Z125 Encounter for screening for malignant neoplasm of prostate: Secondary | ICD-10-CM

## 2020-11-14 DIAGNOSIS — E785 Hyperlipidemia, unspecified: Secondary | ICD-10-CM

## 2020-11-26 ENCOUNTER — Ambulatory Visit: Payer: Self-pay | Admitting: Physician Assistant

## 2020-11-26 ENCOUNTER — Other Ambulatory Visit (HOSPITAL_COMMUNITY)
Admission: RE | Admit: 2020-11-26 | Discharge: 2020-11-26 | Disposition: A | Discharge status: Home / Self Care | Payer: Self-pay | Source: Ambulatory Visit | Attending: Physician Assistant | Admitting: Physician Assistant

## 2020-11-26 ENCOUNTER — Other Ambulatory Visit: Payer: Self-pay

## 2020-11-26 DIAGNOSIS — Z125 Encounter for screening for malignant neoplasm of prostate: Secondary | ICD-10-CM | POA: Insufficient documentation

## 2020-11-26 DIAGNOSIS — E785 Hyperlipidemia, unspecified: Secondary | ICD-10-CM | POA: Insufficient documentation

## 2020-11-26 LAB — LIPID PANEL
Cholesterol: 200 mg/dL (ref 0–200)
HDL: 47 mg/dL (ref >40)
LDL Cholesterol: 117 mg/dL — ABNORMAL HIGH (ref 0–99)
Total CHOL/HDL Ratio: 4.3 RATIO
Triglycerides: 180 mg/dL — ABNORMAL HIGH (ref <150)
VLDL: 36 mg/dL (ref 0–40)

## 2020-11-26 LAB — COMPREHENSIVE METABOLIC PANEL
ALT: 36 U/L (ref 0–44)
AST: 28 U/L (ref 15–41)
Albumin: 3.9 g/dL (ref 3.5–5.0)
Alkaline Phosphatase: 69 U/L (ref 38–126)
Anion gap: 7 (ref 5–15)
BUN: 20 mg/dL (ref 6–20)
CO2: 25 mmol/L (ref 22–32)
Calcium: 8.9 mg/dL (ref 8.9–10.3)
Chloride: 106 mmol/L (ref 98–111)
Creatinine, Ser: 0.97 mg/dL (ref 0.61–1.24)
GFR, Estimated: >60 mL/min (ref >60)
Glucose, Bld: 112 mg/dL — ABNORMAL HIGH (ref 70–99)
Potassium: 4.4 mmol/L (ref 3.5–5.1)
Sodium: 138 mmol/L (ref 135–145)
Total Bilirubin: 0.5 mg/dL (ref 0.3–1.2)
Total Protein: 6.6 g/dL (ref 6.5–8.1)

## 2020-11-26 LAB — PSA: Prostatic Specific Antigen: 0.4 ng/mL (ref 0.00–4.00)

## 2020-11-29 ENCOUNTER — Ambulatory Visit: Payer: Self-pay | Admitting: Physician Assistant

## 2020-11-29 DIAGNOSIS — Z789 Other specified health status: Secondary | ICD-10-CM

## 2020-11-29 DIAGNOSIS — E785 Hyperlipidemia, unspecified: Secondary | ICD-10-CM

## 2020-11-29 MED ORDER — ATORVASTATIN CALCIUM 20 MG PO TABS: ORAL_TABLET | ORAL | 6 refills | Status: DC

## 2020-11-29 NOTE — Patient Instructions (Signed)
Plan de alimentacin restringido en grasas y colesterol Fat and Cholesterol Restricted Eating Plan Seguir una dieta restringida en grasas y colesterol puede ayudar a reducir el riesgo de tener enfermedades cardacas y otras afecciones. El cuerpo necesita grasas y colesterol para las funciones bsicas; no obstante, si estas sustancias se consumen en exceso, pueden ser perjudiciales para la salud. El mdico puede solicitarle anlisis de laboratorio para comprobar sus niveles de grasas (lpidos) y colesterol en la sangre. Ese anlisis le ayuda al mdico a comprender cul es su riesgo de tener ciertas afecciones y si es necesario que haga cambios en su alimentacin. Consulte con su mdico o nutricionista para crear un plan de alimentacin que sea adecuado para usted. Su plan incluye lo siguiente:  Limitar la ingesta de grasas al _______% o menos del total de caloras por da.  Limitar la ingesta de grasas saturadas al _______% o menos del total de caloras por da.  Limitar la cantidad de colesterol en su dieta a menos de _________mg Darlin Coco.  Comer ___________ g de fibra por da. Consejos para seguir [place] [employer] plan [place] generales  Si tiene sobrepeso, trabaje con el mdico para adelgazar sin riegos. Perder solo del 5 al 10% de su peso puede mejorar su estado de salud general y Air traffic controller a prevenir enfermedades como la diabetes y las enfermedades cardacas.  Evite lo siguiente: ? Alimentos con Holiday representative. ? Comidas fritas. ? Alimentos que contienen aceites parcialmente hidrogenados, como margarina en barra, algunas margarinas untables, galletitas dulces, galletas saladas y otros productos horneados.  Limite el consumo de alcohol a no ms de 17medida por da si es mujer y no est Adin, y a 82medidas por da si es hombre. Una medida equivale a 12oz de Chartered certified accountant, 5oz de vino o 1oz de bebidas alcohlicas de alta graduacin.   Lea las etiquetas de los alimentos  Lea las etiquetas de los  alimentos para conocer lo siguiente: ? Si contienen grasas trans, aceites parcialmente hidrogenados o altas cantidades de grasas saturadas. Evite los alimentos que contengan grasas saturadas y grasas trans. ? La cantidad de colesterol que contiene cada porcin. Intente comer no ms de 200mg  de colesterol por da. ? La cantidad de fibra que contiene cada porcin. Intente comer al [employer] 20 y 30g de Set designer.  Elija alimentos con grasas saludables, tales como las siguientes: ? Grasas monoinsaturadas y poliinsaturadas. Estas incluyen aceite de oliva y de canola, semillas de lino, nueces, almendras y semillas. ? Grasas omega3. Estas se encuentran en alimentos tales como el salmn, la caballa, las sardinas, el atn, el aceite de lino y las semillas de lino molidas.  Elija productos de cereal que tengan cereales integrales. Busque la palabra "integral" en Equities trader de la lista de ingredientes. Al cocinar  Evite frer los alimentos a la hora de la coccin. Algunas opciones de coccin saludables son hornear, hervir, Interior and spatial designer y asar a la parrilla.  Consuma ms comida casera y menos de restaurante, de bares y comida rpida.  Evite cocinar usando grasas saturadas. ? Las grasas saturadas de origen animal incluyen carnes, [place] y crema. ? Las grasas saturadas de origen vegetal incluyen aceite de palma, de palmiste y de coco. Planificacin de las comidas  En las comidas, imagine dividir su plato en cuartos: ? Llene la mitad del plato con verduras y ensaladas de hojas verdes. ? Llene un cuarto del plato con cereales integrales. ? Llene un cuarto del plato con alimentos con protenas magras.  Coma pescado con  alto contenido de grasas omega3 al [employer] veces por semana.  Coma ms alimentos que contengan fibra, como cereales [place], frijoles, [place], [place], [place], guisantes y [place]. Estos alimentos favorecen niveles de colesterol saludables en la sangre.   Alimentos  recomendados [employer], como panes, Administrator, cereales y pastas de integrales o de trigo integral. Avena sin endulzar, trigo bulgur, cebada, quinua o arroz integral. Tortillas de harina de maz o trigo integral. [place]  Verduras frescas o congeladas (crudas, al vapor, asadas o grilladas). Ensaladas de hojas verdes. Lambert Mody  Lambert Mody frescas, en conserva (en su jugo natural) o frutas congeladas. Carnes y otros alimentos ricos en protenas  Carne de res molida (al 85% o ms magra), carne de res de animales alimentados con pastos o carne de res sin la grasa. Pollo o pavo sin piel. Carne de pollo o de [place]. Cerdo sin la grasa. Todos los pescados y frutos de mar. Claras de huevo. Porotos, guisantes o lentejas secos. Frutos secos o semillas sin sal. Frijoles enlatados sin sal. Mantequillas de frutos secos naturales sin azcar ni aceites agregados. Lcteos  Productos lcteos descremados o semidescremados, como [employer] o al 1%, quesos reducidos en grasas o al 2%, queso cottage o ricota con bajo contenido de [place] o sin contenido de [place], o yogur natural descremado o semidescremado. Grasas y aceites  Margarina untable que no contenga grasas trans. [place] y condimentos para ensaladas livianos o reducidos en grasas. [place]. Aceites de oliva, canola, ssamo o crtamo. Es posible que los productos que se enumeran ms [place] no constituyan una lista completa de los alimentos y las bebidas que puede tomar. Consulte a un nutricionista para obtener ms informacin. Alimentos a Air cabin crew. Pastas blancas. Arroz blanco. Pan de maz. Bagels, pasteles y croissants. Galletas saladas y colaciones que contengan grasas trans y aceites hidrogenados. Verduras  Verduras cocinadas con salsas de queso, crema o mantequilla. Verduras fritas. Lambert Mody  Fruta enlatada en almbar espeso. Frutas con salsa de crema o mantequilla. Frutas cocidas en aceite. Carnes y  otros alimentos ricos en protenas  [place]ortes de carne con alto contenido de [place]. Costillas, alas de pollo, tocineta, salchicha, mortadela, salame, chinchulines, tocino, perros calientes, salchichas alemanas y embutidos envasados. Hgado y otros rganos. Huevos enteros y yemas de [place]. Pollo y pavo con piel. Carne frita. Lcteos  Leche entera o al 2%, crema, mezcla de [place] y crema, y queso crema. Quesos enteros. Yogur entero o endulzado. Quesos con toda su grasa. [place]rem[place] no lcteas y coberturas batidas. Quesos procesados, quesos para untar y [place]. Bebidas  Alcohol. Bebidas endulzadas con azcar, como refrescos, limonada y bebidas frutales. Grasas y aceites  [place], [place] en barra, [place] de [place], [place], [place] clarificada o grasa de tocino. Aceites de coco, de palmiste y de palma. Dulces y postres  Jarabe de maz, azcares, miel y Control and instrumentation engineer. Caramelos. Mermeladas y [place]. Chrissie Noa. Cereales endulzados. Galletas, pasteles, bizcochuelos, donas, muffins y helado. Es posible que los productos que se enumeran ms [place] no constituyan una lista completa de los alimentos y las bebidas que Nurse, adult. Consulte a un nutricionista para obtener ms informacin. Resumen  El cuerpo necesita grasas y colesterol para las funciones bsicas. No obstante, si estas sustancias se consumen en exceso, pueden ser perjudiciales para la salud.  Consulte con su mdico y su nutricionista para seguir una dieta con bajo contenido de grasas y colesterol. Hacerlo puede ayudar a disminuir su riesgo de tener enfermedades cardacas y otras afecciones.  Elija grasas  saludables, como las grasas monoinsaturadas y Pharmacist, hospital, y alimentos con alto contenido de cidos grasos omega-3.  Consuma alimentos ricos en fibras, como cereales integrales, frijoles, guisantes, frutas y verduras.  Limite o evite el consumo de alcohol, las comidas fritas y los alimentos con alto contenido de grasas saturadas, aceites  parcialmente hidrogenados y Location manager. Esta informacin no tiene Marine scientist el consejo del mdico. Asegrese de hacerle al mdico cualquier pregunta que tenga. Document Revised: 07/13/2020 Document Reviewed: 07/13/2020 Elsevier Patient Education  2021 [employer].

## 2020-11-29 NOTE — Progress Notes (Signed)
There were no vitals taken for this visit.   Subjective:    Patient ID: Bobby Newton, male    DOB: 05-Jul-1966, 55 y.o.   MRN: [id number]  HPI: [name] is a 55 y.o. male presenting on 11/29/2020 for No chief complaint on file.   HPI   This is a telemedicine appointment due to coronavirus pandemic.  It is via Telephone as pt was having difficulty getting connected through [facility].  I connected with  Bobby Newton on 11/29/20 by a video enabled telemedicine application and verified that I am speaking with the correct person using two identifiers.   I discussed the limitations of evaluation and management by telemedicine. The patient expressed understanding and agreed to proceed.  Pt is at work.  Provider and translator are at office.    Pt is 82yoM with follow up for dyslipidemia.  He has bp machine but says He doesn't have BP his mchine with him.  He says he last checked his bp last week and it was 116-120 on the top.  Pt is Not covid vaccinated     Relevant past medical, surgical, family and social history reviewed and updated as indicated. Interim medical history since our last visit reviewed. Allergies and medications reviewed and updated.   Current Outpatient Medications:  .  atorvastatin (LIPITOR) 20 MG tablet, TOME UNA TABLETA POR VIA ORAL TODOS LOS DIAS, Disp: 30 tablet, Rfl: 6 .  Omega-3 Fatty Acids (FISH OIL PO), Take 1-2 capsules by mouth daily. , Disp: , Rfl:      Review of Systems  Per HPI unless specifically indicated above     Objective:    There were no vitals taken for this visit.  Wt Readings from Last 3 Encounters:  08/01/20 157 lb (71.2 kg)  01/30/20 159 lb 3.2 oz (72.2 kg)  10/31/19 162 lb 11.2 oz (73.8 kg)    Physical Exam Pulmonary:     Effort: No respiratory distress.     Comments: Pt is talking in complete sentences without sob.  Neurological:     Mental Status: He is alert and oriented to person, place, and time.   Psychiatric:        Attention and Perception: Attention normal.        Speech: Speech normal.       Results for orders placed or performed during the hospital encounter of 11/26/20  PSA  Result Value Ref Range   Prostatic Specific Antigen 0.40 0.00 - 4.00 ng/mL  Lipid panel  Result Value Ref Range   Cholesterol 200 0 - 200 mg/dL   Triglycerides 180 (H) <150 mg/dL   HDL 47 >40 mg/dL   Total CHOL/HDL Ratio 4.3 RATIO   VLDL 36 0 - 40 mg/dL   LDL Cholesterol 117 (H) 0 - 99 mg/dL  Comprehensive metabolic panel  Result Value Ref Range   Sodium 138 135 - 145 mmol/L   Potassium 4.4 3.5 - 5.1 mmol/L   Chloride 106 98 - 111 mmol/L   CO2 25 22 - 32 mmol/L   Glucose, Bld 112 (H) 70 - 99 mg/dL   BUN 20 6 - 20 mg/dL   Creatinine, Ser 0.97 0.61 - 1.24 mg/dL   Calcium 8.9 8.9 - 10.3 mg/dL   Total Protein 6.6 6.5 - 8.1 g/dL   Albumin 3.9 3.5 - 5.0 g/dL   AST 28 15 - 41 U/L   ALT 36 0 - 44 U/L   Alkaline Phosphatase 69 38 - 126 U/L  Total Bilirubin 0.5 0.3 - 1.2 mg/dL   GFR, Estimated >60 >60 mL/min   Anion gap 7 5 - 15      Assessment & Plan:    Encounter Diagnoses  Name Primary?  . Hyperlipidemia, unspecified hyperlipidemia type Yes  . Not proficient in [place] language      -Reviewed labs with pt -encouraged Lowfat diet.  Pt is mailed reading information on lowfat diet -encouraged pt to get covid vaccination to reduce risks of life-threatening covid infection -pt to follow up 3 months.  He is to contact office sooner prn

## 2020-11-30 ENCOUNTER — Encounter: Payer: Self-pay | Admitting: Physician Assistant

## 2021-02-20 ENCOUNTER — Other Ambulatory Visit: Payer: Self-pay | Admitting: Physician Assistant

## 2021-02-20 DIAGNOSIS — R7309 Other abnormal glucose: Secondary | ICD-10-CM

## 2021-02-20 DIAGNOSIS — E785 Hyperlipidemia, unspecified: Secondary | ICD-10-CM

## 2021-02-20 DIAGNOSIS — Z131 Encounter for screening for diabetes mellitus: Secondary | ICD-10-CM

## 2021-03-01 ENCOUNTER — Other Ambulatory Visit (HOSPITAL_COMMUNITY)
Admission: RE | Admit: 2021-03-01 | Discharge: 2021-03-01 | Disposition: A | Discharge status: Home / Self Care | Payer: Self-pay | Source: Ambulatory Visit | Attending: Physician Assistant | Admitting: Physician Assistant

## 2021-03-01 ENCOUNTER — Other Ambulatory Visit: Payer: Self-pay

## 2021-03-01 DIAGNOSIS — Z131 Encounter for screening for diabetes mellitus: Secondary | ICD-10-CM | POA: Insufficient documentation

## 2021-03-01 DIAGNOSIS — E785 Hyperlipidemia, unspecified: Secondary | ICD-10-CM | POA: Insufficient documentation

## 2021-03-01 DIAGNOSIS — R7309 Other abnormal glucose: Secondary | ICD-10-CM | POA: Insufficient documentation

## 2021-03-01 LAB — COMPREHENSIVE METABOLIC PANEL
ALT: 30 U/L (ref 0–44)
AST: 26 U/L (ref 15–41)
Albumin: 4 g/dL (ref 3.5–5.0)
Alkaline Phosphatase: 65 U/L (ref 38–126)
Anion gap: 8 (ref 5–15)
BUN: 14 mg/dL (ref 6–20)
CO2: 24 mmol/L (ref 22–32)
Calcium: 8.5 mg/dL — ABNORMAL LOW (ref 8.9–10.3)
Chloride: 104 mmol/L (ref 98–111)
Creatinine, Ser: 0.99 mg/dL (ref 0.61–1.24)
GFR, Estimated: >60 mL/min (ref >60)
Glucose, Bld: 104 mg/dL — ABNORMAL HIGH (ref 70–99)
Potassium: 3.9 mmol/L (ref 3.5–5.1)
Sodium: 136 mmol/L (ref 135–145)
Total Bilirubin: 1.1 mg/dL (ref 0.3–1.2)
Total Protein: 6.9 g/dL (ref 6.5–8.1)

## 2021-03-01 LAB — LIPID PANEL
Cholesterol: 163 mg/dL (ref 0–200)
HDL: 54 mg/dL (ref >40)
LDL Cholesterol: 86 mg/dL (ref 0–99)
Total CHOL/HDL Ratio: 3 RATIO
Triglycerides: 116 mg/dL (ref <150)
VLDL: 23 mg/dL (ref 0–40)

## 2021-03-02 LAB — HEMOGLOBIN A1C
Hgb A1c MFr Bld: 6.1 % — ABNORMAL HIGH (ref 4.8–5.6)
Mean Plasma Glucose: 128 mg/dL

## 2021-03-05 ENCOUNTER — Ambulatory Visit: Payer: Self-pay | Admitting: Physician Assistant

## 2021-03-05 ENCOUNTER — Encounter: Payer: Self-pay | Admitting: Physician Assistant

## 2021-03-05 ENCOUNTER — Other Ambulatory Visit: Payer: Self-pay

## 2021-03-05 VITALS — BP 124/70 | HR 56 | Temp 95.7°F | Wt 157.0 lb

## 2021-03-05 DIAGNOSIS — Z789 Other specified health status: Secondary | ICD-10-CM

## 2021-03-05 DIAGNOSIS — E785 Hyperlipidemia, unspecified: Secondary | ICD-10-CM

## 2021-03-05 DIAGNOSIS — R7303 Prediabetes: Secondary | ICD-10-CM

## 2021-03-05 MED ORDER — ATORVASTATIN CALCIUM 20 MG PO TABS: ORAL_TABLET | ORAL | 6 refills | Status: AC

## 2021-03-05 NOTE — Progress Notes (Signed)
BP 124/70   Pulse (!) 56   Temp (!) 95.7 F (35.4 C)   Wt 157 lb (71.2 kg)   SpO2 97%   BMI 27.59 kg/m    Subjective:    Patient ID: Bobby Newton, male    DOB: 08/23/1966, 55 y.o.   MRN: [id number]  HPI: Bobby Newton is a 55 y.o. male presenting on 03/05/2021 for Hyperlipidemia   HPI    Pt had a negative covid 19 screening questionnaire.     Chief Complaint  Patient presents with  . Hyperlipidemia    Pt works doing Biomedical scientist.  He has not gotten the covid vaccination    Relevant past medical, surgical, family and social history reviewed and updated as indicated. Interim medical history since our last visit reviewed. Allergies and medications reviewed and updated.    Current Outpatient Medications:  .  atorvastatin (LIPITOR) 20 MG tablet, TOME UNA TABLETA POR VIA ORAL TODOS LOS DIAS, Disp: 30 tablet, Rfl: 6 .  Omega-3 Fatty Acids (FISH OIL PO), Take 1-2 capsules by mouth daily. , Disp: , Rfl:    Review of Systems  Per HPI unless specifically indicated above     Objective:    BP 124/70   Pulse (!) 56   Temp (!) 95.7 F (35.4 C)   Wt 157 lb (71.2 kg)   SpO2 97%   BMI 27.59 kg/m   Wt Readings from Last 3 Encounters:  03/05/21 157 lb (71.2 kg)  08/01/20 157 lb (71.2 kg)  01/30/20 159 lb 3.2 oz (72.2 kg)    Physical Exam Vitals reviewed.  Constitutional:      Appearance: He is well-developed.  HENT:     Head: Normocephalic and atraumatic.  Cardiovascular:     Rate and Rhythm: Normal rate and regular rhythm.  Pulmonary:     Effort: Pulmonary effort is normal.     Breath sounds: Normal breath sounds. No wheezing.  Abdominal:     General: Bowel sounds are normal.     Palpations: Abdomen is soft.     Tenderness: There is no abdominal tenderness.  Musculoskeletal:     Cervical back: Neck supple.     Right lower leg: No edema.     Left lower leg: No edema.  Lymphadenopathy:     Cervical: No cervical adenopathy.  Skin:    General: Skin  is warm and dry.  Neurological:     Mental Status: He is alert and oriented to person, place, and time.  Psychiatric:        Behavior: Behavior normal.     Results for orders placed or performed during the hospital encounter of 03/01/21  Hemoglobin A1c  Result Value Ref Range   Hgb A1c MFr Bld 6.1 (H) 4.8 - 5.6 %   Mean Plasma Glucose 128 mg/dL  Comprehensive metabolic panel  Result Value Ref Range   Sodium 136 135 - 145 mmol/L   Potassium 3.9 3.5 - 5.1 mmol/L   Chloride 104 98 - 111 mmol/L   CO2 24 22 - 32 mmol/L   Glucose, Bld 104 (H) 70 - 99 mg/dL   BUN 14 6 - 20 mg/dL   Creatinine, Ser 0.99 0.61 - 1.24 mg/dL   Calcium 8.5 (L) 8.9 - 10.3 mg/dL   Total Protein 6.9 6.5 - 8.1 g/dL   Albumin 4.0 3.5 - 5.0 g/dL   AST 26 15 - 41 U/L   ALT 30 0 - 44 U/L   Alkaline Phosphatase 65 38 -  126 U/L   Total Bilirubin 1.1 0.3 - 1.2 mg/dL   GFR, Estimated >60 >60 mL/min   Anion gap 8 5 - 15  Lipid panel  Result Value Ref Range   Cholesterol 163 0 - 200 mg/dL   Triglycerides 116 <150 mg/dL   HDL 54 >40 mg/dL   Total CHOL/HDL Ratio 3.0 RATIO   VLDL 23 0 - 40 mg/dL   LDL Cholesterol 86 0 - 99 mg/dL      Assessment & Plan:    Encounter Diagnoses  Name Primary?  . Hyperlipidemia, unspecified hyperlipidemia type Yes  . Prediabetes   . Not proficient in [place] language      -reviewed labs with pt  -pt counseled on prediabetes -pt counseled on covid and he was encouraged to get vaccination -pt to continue  Current medications -pt to follow up 4 months.  He is to contact office sooner prn

## 2021-03-05 NOTE — Patient Instructions (Signed)
Plan de alimentacin para personas con prediabetes Prediabetes Eating Plan La prediabetes es una afeccin que hace que los niveles de azcar en la sangre (glucosa) sean ms altos de lo normal. Esto aumenta el riesgo de desarrollar diabetes tipo 2 (diabetes mellitus tipo2). Trabajar con un profesional de la salud o especialista en nutricin (nutricionista) para Teacher, English as a foreign language en la dieta y el estilo de vida puede ayudar a prevenir el inicio de la diabetes. Estos cambios pueden ayudarlo a:  [employer] de glucemia.  Mejorar los niveles de [place].  Controlar la presin arterial. Consejos para seguir [place] plan Al leer las etiquetas de los alimentos  Lea las etiquetas de los alimentos envasados para controlar la cantidad de grasa, sal (sodio) y azcar que contienen. Evite los alimentos que contengan lo siguiente: ? Grasas saturadas. ? Grasas trans. ? Azcares agregados.  Evite los alimentos que contengan ms de 369miligramos(mg) de sodio por porcin. Limite el consumo de sodio a menos de 2300mg  por da. Al ir de compras  Evite comprar alimentos procesados y preelaborados.  Evite comprar bebidas con azcar agregada. Al cocinar  Cocine con aceite de oliva. No use mantequilla, manteca de cerdo ni mantequilla clarificada.  Cocine los alimentos al horno, a la parrilla, asados, al vapor o hervidos. Evite frerlos. Planificacin de las comidas  Trabaje con el nutricionista para crear un plan de alimentacin que sea adecuado para usted. Esto puede incluir el seguimiento de cuntas caloras ingiere al da. Use un registro de alimentos, un cuaderno o una aplicacin mvil para anotar lo que comi en cada comida.  Considere la posibilidad de seguir Animal nutritionist. Esta puede comprender lo siguiente: ? Comer varias porciones de frutas y verduras frescas por Web designer. ? [place] al Training and development officer veces por semana. ? Comer una porcin de cereales integrales, frijoles, frutos secos y  semillas por da. ? Aceite de [employer] de otras grasas. ? Limitar el consumo de alcohol. ? [place]. ? Usar productos lcteos descremados o con bajo contenido de [place].  Considerar seguir [place] dieta a base de vegetales. Esta incluye hacer elecciones alimentarias que se concentren en comer principalmente verduras y frutas, cereales, frijoles, frutos secos y semillas.  Si tiene hipertensin arterial, quizs Ardelia Mems consumo de sodio o seguir una dieta como el plan de alimentacin basado en los Enfoques Alimentarios para Detener la Hipertensin (Dietary Approaches to Stop Hypertension, DASH). La dieta DASH tiene como objetivo bajar la hipertensin arterial.   Development worker, international aid de vida  Establezca metas para bajar de peso con la ayuda de su equipo de atencin mdica. A la Kary Kos con prediabetes se les recomienda bajar un 7% de su peso corporal.  [employer] al menos 30 minutos de ejercicio, 5 o ms das a Sherilyn Cooter.  Asista a un grupo de apoyo o solicite el apoyo de un consejero de salud mental.  Use los medicamentos de venta libre y los recetados solamente como se lo haya indicado el mdico. Qu alimentos se recomiendan? 05-29-1993 [place]. Bananas. Manzanas. Naranjas. Uvas. Papaya. Mango. [place]. Kiwi. [place]. Cerezas. [address] [place]. [place]. Guisantes. Remolachas. Coliflor. Repollo. Brcoli. Zanahorias. Tomates. [place]. Valeda Malm. Hierbas. Pimientos. Cebollas. Pepinos. Coles de Bruselas. Granos Productos integrales, como panes, [place], cereales y pastas de salvado o integrales. Avena sin azcar. Trigo burgol. Cebada. Quinua. Arroz integral. Tacos o tortillas de harina de maz o de salvado. Carnes y [place]. Carne de ave sin piel. Cortes magros de cerdo y carne de res. Tofu.  Huevos. Frutos secos. Frijoles. Lcteos Productos lcteos descremados o semidescremados, como yogur, queso cottage y [place]. [employer]. T. Caf. Gaseosas sin azcar o  dietticas. Soda. Leche descremada o con bajo contenido de [place]. Productos alternativos a la [place], como [place] de soja o de [place]. Grasas y aceites Aceite de [place]. Aceite de canola. Aceite de girasol. Aceite de semillas de uva. [place]. Nueces. Dulces y postres Pudin sin azcar o con bajo contenido de [place]. Helado y otros postres congelados sin azcar o con bajo contenido de [place]. Alios y condimentos Hierbas. Especias sin sodio. Mostaza. Salsa de pepinillos. [place] con bajo contenido de sal y de Location manager. Salsa barbacoa con bajo contenido de sal y de azcar. Mayonesa con bajo contenido de grasa o sin grasa. Es posible que los productos mencionados arriba no formen una lista completa de las bebidas o los alimentos recomendados. Consulte a un nutricionista para obtener ms informacin. Qu alimentos no se recomiendan? Lambert Mody Frutas enlatadas al almbar. Verduras Verduras enlatadas. Verduras congeladas con mantequilla o salsa de crema. Granos Productos elaborados con [place] y [place], como panes, pastas, bocadillos y cereales. Carnes y otras protenas Cortes de carne con alto contenido de Lobbyist. Carne de ave con piel. Carne empanizada o frita. Carnes procesadas. Lcteos Yogur, [place] enteros. Bebidas Bebidas azucaradas, como t helado y [place]. Grasas y [employer]. [place]pin. Mantequilla clarificada. Dulces y [employer], como pasteles, pastelitos, galletas dulces y tarta de [address]. Alios y condimentos Mezclas de especias con sal agregada. [place]. Salsa barbacoa. Mayonesa. Es posible que los productos que se enumeran ms arriba no sean una lista completa de los alimentos y las bebidas que no se recomiendan. Consulte a un nutricionista para obtener ms informacin. Dnde buscar ms informacin  American Diabetes Association (Asociacin Estadounidense de la Diabetes): www.diabetes.org Resumen  Es posible que deba hacer  cambios en la dieta y el estilo de vida para ayudar a prevenir el inicio de la diabetes. Estos cambios pueden ayudarlo a Forensic scientist, mejorar los niveles de colesterol y Aeronautical engineer presin arterial.  Establezca metas para bajar de peso con la ayuda de su equipo de atencin mdica. A la [employer] con prediabetes se les recomienda bajar un 7% de su peso corporal.  Considere la posibilidad de seguir Web designer. Esto incluye comer muchas frutas y verduras frescas, cereales integrales, frijoles, frutos secos, semillas, pescado y productos lcteos con bajo contenido de [place], y usar aceite de Tour manager de otras grasas. Esta informacin no tiene Marine scientist el consejo del mdico. Asegrese de hacerle al mdico cualquier pregunta que tenga. Document Revised: 03/13/2020 Document Reviewed: 03/13/2020 Elsevier Patient Education  2021 [employer].

## 2021-06-18 ENCOUNTER — Other Ambulatory Visit: Payer: Self-pay | Admitting: Physician Assistant

## 2021-06-18 DIAGNOSIS — E785 Hyperlipidemia, unspecified: Secondary | ICD-10-CM

## 2021-06-28 ENCOUNTER — Other Ambulatory Visit (HOSPITAL_COMMUNITY)
Admission: RE | Admit: 2021-06-28 | Discharge: 2021-06-28 | Disposition: A | Discharge status: Home / Self Care | Payer: Self-pay | Source: Ambulatory Visit | Attending: Physician Assistant | Admitting: Physician Assistant

## 2021-06-28 DIAGNOSIS — E785 Hyperlipidemia, unspecified: Secondary | ICD-10-CM | POA: Insufficient documentation

## 2021-06-28 LAB — LIPID PANEL
Cholesterol: 175 mg/dL (ref 0–200)
HDL: 47 mg/dL (ref >40)
LDL Cholesterol: 104 mg/dL — ABNORMAL HIGH (ref 0–99)
Total CHOL/HDL Ratio: 3.7 RATIO
Triglycerides: 118 mg/dL (ref <150)
VLDL: 24 mg/dL (ref 0–40)

## 2021-06-28 LAB — HEPATIC FUNCTION PANEL
ALT: 30 U/L (ref 0–44)
AST: 28 U/L (ref 15–41)
Albumin: 4.4 g/dL (ref 3.5–5.0)
Alkaline Phosphatase: 72 U/L (ref 38–126)
Bilirubin, Direct: 0.1 mg/dL (ref 0.0–0.2)
Indirect Bilirubin: 0.8 mg/dL (ref 0.3–0.9)
Total Bilirubin: 0.9 mg/dL (ref 0.3–1.2)
Total Protein: 7.4 g/dL (ref 6.5–8.1)

## 2021-07-03 ENCOUNTER — Encounter: Payer: Self-pay | Admitting: Physician Assistant

## 2021-07-03 ENCOUNTER — Ambulatory Visit: Payer: Self-pay | Admitting: Physician Assistant

## 2021-07-03 DIAGNOSIS — Z2821 Immunization not carried out because of patient refusal: Secondary | ICD-10-CM

## 2021-07-03 DIAGNOSIS — Z789 Other specified health status: Secondary | ICD-10-CM

## 2021-07-03 DIAGNOSIS — E785 Hyperlipidemia, unspecified: Secondary | ICD-10-CM

## 2021-07-03 NOTE — Progress Notes (Signed)
   There were no vitals taken for this visit.   Subjective:    Patient ID: Bobby Newton, male    DOB: 12-11-1965, 55 y.o.   MRN: [id number]  HPI: Bobby Newton is a 55 y.o. male presenting on 07/03/2021 for No chief complaint on file.   HPI   This is a telemedicine appointment.  It was via telephone as pt ws having difficulty connecting through [employer].  I connected with  Bobby Newton on 07/03/21 by a video enabled telemedicine application and verified that I am speaking with the correct person using two identifiers.   I discussed the limitations of evaluation and management by telemedicine. The patient expressed understanding and agreed to proceed.  Pt is at work.  Provider and translator are at office.   Pt is 48yoM with routine appointment for follow up dyslipidemia.  Pt says he is doing well and he has no complaints.   Relevant past medical, surgical, family and social history reviewed and updated as indicated. Interim medical history since our last visit reviewed. Allergies and medications reviewed and updated.   Current Outpatient Medications:    atorvastatin (LIPITOR) 20 MG tablet, TOME UNA TABLETA POR VIA ORAL TODOS LOS DIAS, Disp: 30 tablet, Rfl: 6   Omega-3 Fatty Acids (FISH OIL PO), Take 1-2 capsules by mouth daily. , Disp: , Rfl:     Review of Systems  Per HPI unless specifically indicated above     Objective:    There were no vitals taken for this visit.  Wt Readings from Last 3 Encounters:  03/05/21 157 lb (71.2 kg)  08/01/20 157 lb (71.2 kg)  01/30/20 159 lb 3.2 oz (72.2 kg)    Physical Exam Pulmonary:     Effort: No respiratory distress.     Comments: Pt is talking in complete sentences without dyspnea Neurological:     Mental Status: He is alert and oriented to person, place, and time.  Psychiatric:        Attention and Perception: Attention normal.        Speech: Speech normal.        Behavior: Behavior is cooperative.    Results for  orders placed or performed during the hospital encounter of 06/28/21  Hepatic function panel  Result Value Ref Range   Total Protein 7.4 6.5 - 8.1 g/dL   Albumin 4.4 3.5 - 5.0 g/dL   AST 28 15 - 41 U/L   ALT 30 0 - 44 U/L   Alkaline Phosphatase 72 38 - 126 U/L   Total Bilirubin 0.9 0.3 - 1.2 mg/dL   Bilirubin, Direct 0.1 0.0 - 0.2 mg/dL   Indirect Bilirubin 0.8 0.3 - 0.9 mg/dL  Lipid panel  Result Value Ref Range   Cholesterol 175 0 - 200 mg/dL   Triglycerides 118 <150 mg/dL   HDL 47 >40 mg/dL   Total CHOL/HDL Ratio 3.7 RATIO   VLDL 24 0 - 40 mg/dL   LDL Cholesterol 104 (H) 0 - 99 mg/dL      Assessment & Plan:    Encounter Diagnoses  Name Primary?   Hyperlipidemia, unspecified hyperlipidemia type Yes   Not proficient in [place] language    COVID-19 vaccination declined       -Reviewed labs with pt -pt to Continue current medications -pt was offered appointment to get covid vaccination and he declined -pt to follow up 6 months.  He is to contact office sooner prn

## 2021-12-18 ENCOUNTER — Telehealth: Payer: Self-pay | Admitting: Physician Assistant

## 2021-12-18 NOTE — Telephone Encounter (Signed)
Pt has been notified that he needs to re-screen with care [facility] before His appt on 12/30/2021 ?

## 2021-12-30 ENCOUNTER — Ambulatory Visit: Payer: Self-pay | Admitting: Physician Assistant

## 2021-12-31 ENCOUNTER — Ambulatory Visit: Payer: Self-pay | Admitting: Physician Assistant

## 2022-03-11 IMAGING — DX DG CHEST 1V PORT
1 series · 1 of 1 positions shown · non-contrast
Comparison: None.

CLINICAL DATA: 53-year-old male with shortness of breath. Negative
for [id number] in [REDACTED], being retested today.

EXAM:
PORTABLE CHEST 1 VIEW

[chest ap]
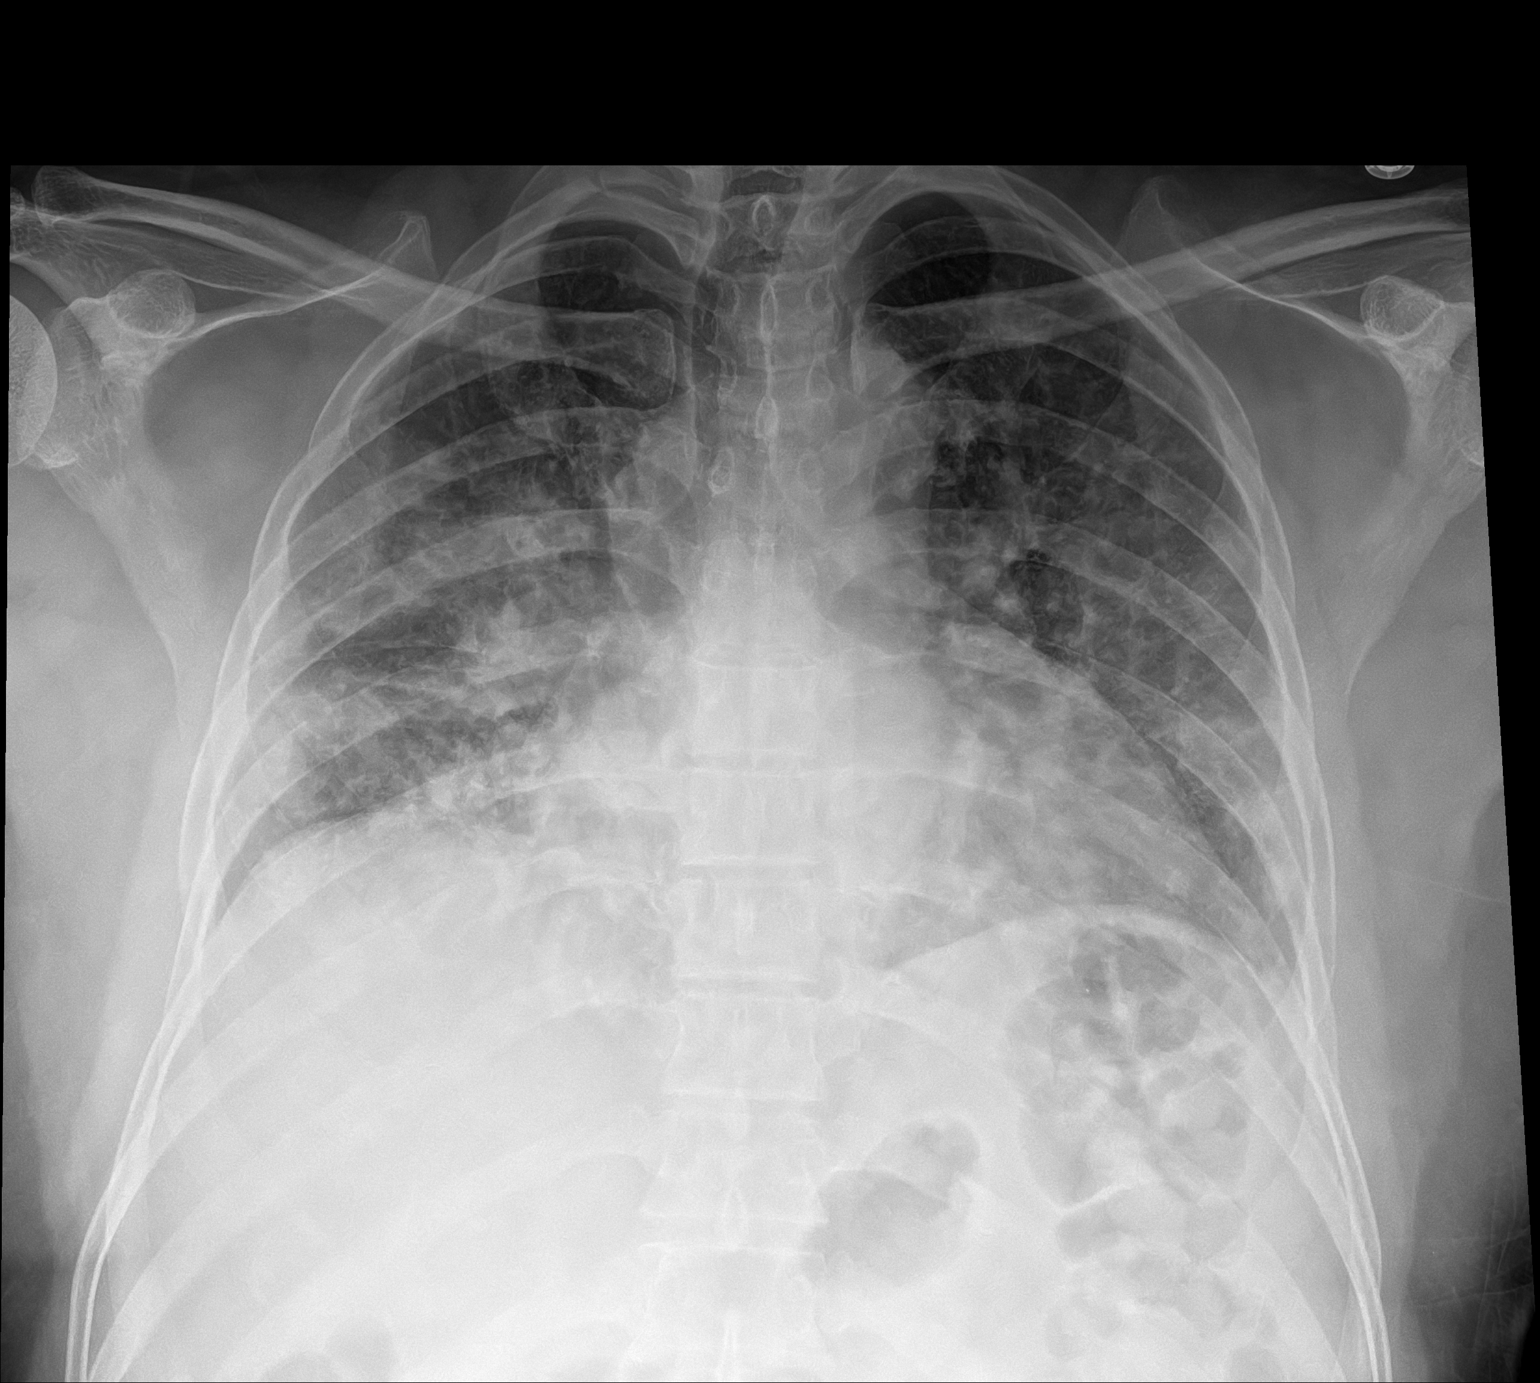

[1 of 1 positions shown; findings below may reference images not displayed]

FINDINGS: Portable AP upright view at 0595 hours. Low lung volumes, probably
extension waiting cardiac size. Other mediastinal contours are
within normal limits. Visualized tracheal air column is within
normal limits.

Widespread bilateral coarse and indistinct pulmonary opacity,
relatively sparing the lung apices. No pneumothorax or pleural
effusion identified.

Negative visible bowel gas pattern. No acute osseous abnormality
identified.
IMPRESSION: Widespread bilateral indistinct pulmonary opacity. Favor acute
viral/atypical respiratory infection over asymmetric pulmonary edema
given the apparent lack of pleural fluid.

## 2023-04-08 ENCOUNTER — Emergency Department (HOSPITAL_COMMUNITY): Payer: Self-pay

## 2023-04-08 ENCOUNTER — Encounter (HOSPITAL_COMMUNITY): Payer: Self-pay | Admitting: Emergency Medicine

## 2023-04-08 ENCOUNTER — Emergency Department (HOSPITAL_COMMUNITY)
Admission: EM | Admit: 2023-04-08 | Discharge: 2023-04-08 | Disposition: A | Discharge status: Home / Self Care | Payer: Self-pay | Attending: Student | Admitting: Student

## 2023-04-08 ENCOUNTER — Other Ambulatory Visit: Payer: Self-pay

## 2023-04-08 DIAGNOSIS — J039 Acute tonsillitis, unspecified: Secondary | ICD-10-CM | POA: Insufficient documentation

## 2023-04-08 DIAGNOSIS — R519 Headache, unspecified: Secondary | ICD-10-CM

## 2023-04-08 DIAGNOSIS — M436 Torticollis: Secondary | ICD-10-CM

## 2023-04-08 DIAGNOSIS — M542 Cervicalgia: Secondary | ICD-10-CM | POA: Insufficient documentation

## 2023-04-08 LAB — BASIC METABOLIC PANEL
Anion gap: 7 (ref 5–15)
BUN: 15 mg/dL (ref 6–20)
CO2: 21 mmol/L — ABNORMAL LOW (ref 22–32)
Calcium: 8.7 mg/dL — ABNORMAL LOW (ref 8.9–10.3)
Chloride: 106 mmol/L (ref 98–111)
Creatinine, Ser: 0.9 mg/dL (ref 0.61–1.24)
GFR, Estimated: >60 mL/min (ref >60)
Glucose, Bld: 118 mg/dL — ABNORMAL HIGH (ref 70–99)
Potassium: 4.1 mmol/L (ref 3.5–5.1)
Sodium: 134 mmol/L — ABNORMAL LOW (ref 135–145)

## 2023-04-08 LAB — CBC WITH DIFFERENTIAL/PLATELET
Abs Immature Granulocytes: 0.04 10*3/uL (ref 0.00–0.07)
Basophils Absolute: 0 10*3/uL (ref 0.0–0.1)
Basophils Relative: 1 %
Eosinophils Absolute: 0.1 10*3/uL (ref 0.0–0.5)
Eosinophils Relative: 2 %
HCT: 44 % (ref 39.0–52.0)
Hemoglobin: 15.1 g/dL (ref 13.0–17.0)
Immature Granulocytes: 1 %
Lymphocytes Relative: 27 %
Lymphs Abs: 1.6 10*3/uL (ref 0.7–4.0)
MCH: 30.6 pg (ref 26.0–34.0)
MCHC: 34.3 g/dL (ref 30.0–36.0)
MCV: 89.2 fL (ref 80.0–100.0)
Monocytes Absolute: 0.5 10*3/uL (ref 0.1–1.0)
Monocytes Relative: 8 %
Neutro Abs: 3.6 10*3/uL (ref 1.7–7.7)
Neutrophils Relative %: 61 %
Platelets: 147 10*3/uL — ABNORMAL LOW (ref 150–400)
RBC: 4.93 MIL/uL (ref 4.22–5.81)
RDW: 12.6 % (ref 11.5–15.5)
WBC: 5.9 10*3/uL (ref 4.0–10.5)
nRBC: 0 % (ref 0.0–0.2)

## 2023-04-08 MED ORDER — PROCHLORPERAZINE EDISYLATE 10 MG/2ML IJ SOLN
10.0000 mg | Freq: Once | INTRAMUSCULAR | Status: AC
  Administered 2023-04-08: 10 mg via INTRAVENOUS
  Filled 2023-04-08: qty 2

## 2023-04-08 MED ORDER — LIDOCAINE 5 % EX PTCH
1.0000 | MEDICATED_PATCH | CUTANEOUS | Status: DC
  Administered 2023-04-08: 1 via TRANSDERMAL
  Filled 2023-04-08: qty 1

## 2023-04-08 MED ORDER — DEXAMETHASONE SODIUM PHOSPHATE 10 MG/ML IJ SOLN
10.0000 mg | Freq: Once | INTRAMUSCULAR | Status: AC
  Administered 2023-04-08: 10 mg via INTRAVENOUS
  Filled 2023-04-08: qty 1

## 2023-04-08 MED ORDER — LIDOCAINE 5 % EX PTCH: 1.0000 | MEDICATED_PATCH | CUTANEOUS | 0 refills | Status: AC

## 2023-04-08 MED ORDER — PREDNISONE 10 MG PO TABS: ORAL_TABLET | ORAL | 0 refills | Status: AC

## 2023-04-08 MED ORDER — DIPHENHYDRAMINE HCL 50 MG/ML IJ SOLN
12.5000 mg | Freq: Once | INTRAMUSCULAR | Status: AC
  Administered 2023-04-08: 12.5 mg via INTRAVENOUS
  Filled 2023-04-08: qty 1

## 2023-04-08 NOTE — ED Triage Notes (Signed)
Pt c/o headache that started at 1 pm yesterday that pt describes as a numbness and tingling sensation that went from his head to his entire R side of body. Also c/o dizziness

## 2023-04-08 NOTE — Discharge Instructions (Signed)
Take prednisone prescribed to help with your neck pain - you received todays dose here through your IV, so take your first dose tomorrow morning.  You may also use the lidoderm patch if the one we gave you today helps relieve your neck pain.  Plan follow up with your primary provider for further management if these medicines do not relieve your symptoms.

## 2023-04-08 NOTE — ED Provider Notes (Signed)
[place] [facility] AT [facility] Provider Note   CSN: [id number] Arrival date & time: 04/08/23  4098     History  Chief Complaint  Patient presents with   Headache    Bobby Newton is a 57 y.o. male presenting for evaluation of headache, described as a generalized throbbing headache which started around 1 PM yesterday.  He states that in association with a headache he noted some tingling in his fingertips initially, then the tingling involved his entire right arm as well.  As the headache has progressed he has had episodes of tingling in his right leg as well.  The symptoms have been waxing and waning, he took a dose of Tylenol last night after which his symptoms resolved for a while.  He endorses some soreness at the right side of his neck as well.  He denies weakness, vision changes, difficulty with speech or swallowing, no facial droop.  He he has had several episodes of transient lightheadedness as well.  He denies chest pain, palpitations, shortness of breath, no nausea or vomiting.  The history is provided by the patient. The history is limited by a language barrier. A language interpreter was used.       Home Medications Prior to Admission medications   Medication Sig Start Date End Date Taking? Authorizing Provider  lidocaine (LIDODERM) 5 % Place 1 patch onto the skin daily. Remove & Discard patch within 12 hours or as directed by MD 04/08/23  Yes Makana Rostad, Raynelle Fanning, PA-C  predniSONE (DELTASONE) 10 MG tablet 6, 5, 4, 3, 2 then 1 tablet by mouth daily for 6 days total. 04/08/23  Yes Danner Paulding, Raynelle Fanning, PA-C  atorvastatin (LIPITOR) 20 MG tablet TOME UNA TABLETA POR VIA ORAL TODOS LOS DIAS 03/05/21   Jacquelin Hawking, PA-C  Omega-3 Fatty Acids (FISH OIL PO) Take 1-2 capsules by mouth daily.     [provider]      Allergies    Patient has no known allergies.    Review of Systems   Review of Systems  Constitutional:  Negative for fever.  HENT:  Negative for  congestion and sore throat.   Eyes: Negative.   Respiratory:  Negative for chest tightness and shortness of breath.   Cardiovascular:  Negative for chest pain.  Gastrointestinal:  Negative for abdominal pain, nausea and vomiting.  Genitourinary: Negative.   Musculoskeletal:  Negative for arthralgias, joint swelling and neck pain.  Skin: Negative.  Negative for rash and wound.  Neurological:  Positive for light-headedness, numbness and headaches. Negative for dizziness and weakness.  Psychiatric/Behavioral: Negative.    All other systems reviewed and are negative.   Physical Exam Updated Vital Signs BP 118/87   Pulse (!) 50   Temp 98.4 F (36.9 C) (Oral)   Resp 16   Ht 5' 3.25" (1.607 m)   Wt 73.4 kg   SpO2 97%   BMI 28.44 kg/m  Physical Exam Vitals and nursing note reviewed.  Constitutional:      Appearance: He is well-developed.  HENT:     Head: Normocephalic and atraumatic.     Right Ear: Tympanic membrane normal.     Left Ear: Tympanic membrane normal.  Eyes:     Extraocular Movements: Extraocular movements intact.     Conjunctiva/sclera: Conjunctivae normal.     Pupils: Pupils are equal, round, and reactive to light.     Visual Fields: Right eye visual fields normal and left eye visual fields normal.  Cardiovascular:  Rate and Rhythm: Normal rate and regular rhythm.     Heart sounds: Normal heart sounds.  Pulmonary:     Effort: Pulmonary effort is normal.     Breath sounds: Normal breath sounds. No wheezing.  Abdominal:     General: Bowel sounds are normal.     Palpations: Abdomen is soft.     Tenderness: There is no abdominal tenderness.  Musculoskeletal:        General: Normal range of motion.     Cervical back: Normal range of motion and neck supple.  Lymphadenopathy:     Cervical: No cervical adenopathy.  Skin:    General: Skin is warm and dry.     Findings: No rash.  Neurological:     General: No focal deficit present.     Mental Status: He is  alert and oriented to person, place, and time.     GCS: GCS eye subscore is 4. GCS verbal subscore is 5. GCS motor subscore is 6.     Cranial Nerves: No cranial nerve deficit.     Sensory: No sensory deficit.     Motor: No pronator drift.     Coordination: Coordination normal. Finger-Nose-Finger Test and Heel to [facility] Test normal. Rapid alternating movements normal.     Gait: Gait normal.     Deep Tendon Reflexes: Reflexes normal.     Comments: Normal heel-shin, normal rapid alternating movements. Cranial nerves III-XII intact.  No pronator drift.  Psychiatric:        Speech: Speech normal.        Behavior: Behavior normal.        Thought Content: Thought content normal.     ED Results / Procedures / Treatments   Labs (all labs ordered are listed, but only abnormal results are displayed) Labs Reviewed  CBC WITH DIFFERENTIAL/PLATELET - Abnormal; Notable for the following components:      Result Value   Platelets 147 (*)    All other components within normal limits  BASIC METABOLIC PANEL - Abnormal; Notable for the following components:   Sodium 134 (*)    CO2 21 (*)    Glucose, Bld 118 (*)    Calcium 8.7 (*)    All other components within normal limits    EKG None  Radiology CT Head Wo Contrast  Result Date: 04/08/2023 CLINICAL DATA:  Provided history: Headache, increasing frequency or severity. Neck pain, abnormal neuro exam, no prior imaging. Additional history provided: numbness and tingling from head to right side of body. Dizziness. EXAM: CT HEAD WITHOUT CONTRAST CT CERVICAL SPINE WITHOUT CONTRAST TECHNIQUE: Multidetector CT imaging of the head and cervical spine was performed following the standard protocol without intravenous contrast. Multiplanar CT image reconstructions of the cervical spine were also generated. RADIATION DOSE REDUCTION: This exam was performed according to the departmental dose-optimization program which includes automated exposure control, adjustment of  the mA and/or kV according to patient size and/or use of iterative reconstruction technique. COMPARISON:  None. FINDINGS: CT HEAD FINDINGS Brain: No age advanced or lobar predominant parenchymal atrophy. There is no acute intracranial hemorrhage. No demarcated cortical infarct. No extra-axial fluid collection. No evidence of an intracranial mass. No midline shift. Vascular: No hyperdense vessel.  Atherosclerotic calcifications. Skull: No calvarial fracture. 5 mm osseous protrusion projecting outward from the midline frontal calvarium compatible with a small osteoma (series 2, image 16). Sinuses/Orbits: No mass or acute finding within the imaged orbits. Mucosal thickening within a posterior left ethmoid air cell. CT CERVICAL  SPINE FINDINGS Alignment: Dextrocurvature of the cervical spine. Mild C6-C7 grade 1 retrolisthesis. Skull base and vertebrae: The basion-dental and atlanto-dental intervals are maintained.No evidence of acute fracture to the cervical spine. Soft tissues and spinal canal: No paraspinal mass or collection. Disc levels: No more than mild disc degeneration within the cervical spine. Small ventral osteophytes at C4-C5, C5-C6 and C6-C7. C2-C3: No significant spinal canal stenosis. No significant bony neural foraminal narrowing. C3-C4: Shallow disc bulge. 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 Mild relative spinal canal narrowing. No significant bony neural foraminal narrowing. C4-C5: Shallow disc bulge. No significant spinal canal stenosis. No significant bony neural foraminal narrowing. C5-C6: Shallow disc bulge. No significant spinal canal stenosis. No significant bony neural foraminal narrowing. C6-C7: Slight grade 1 retrolisthesis. Shallow disc bulge. Bilateral degenerative facet spurring. No significant spinal canal stenosis. Mild bony neural foraminal narrowing on the right. C7-T1: Degenerative facet spurring. No significant spinal canal stenosis. No significant bony neural  foraminal narrowing. Upper chest: No consolidation within the imaged lung apices. IMPRESSION: CT head: 1. No evidence of an acute intracranial abnormality. 2. 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 3. Mild left ethmoid sinus disease. CT cervical spine: 1. Cervical spondylosis as outlined. 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 Mild spinal canal narrowing at C3-C4. Mild bony neural foraminal narrowing on the right at C6-C7. 2. Dextrocurvature of the cervical spine. 3. Mild C6-C7 grade 1 retrolisthesis. Electronically Signed   By: Jackey Loge D.O.   On: 04/08/2023 11:31   CT Cervical Spine Wo Contrast  Result Date: 04/08/2023 CLINICAL DATA:  Provided history: Headache, increasing frequency or severity. Neck pain, abnormal neuro exam, no prior imaging. Additional history provided: numbness and tingling from head to right side of body. Dizziness. EXAM: CT HEAD WITHOUT CONTRAST CT CERVICAL SPINE WITHOUT CONTRAST TECHNIQUE: Multidetector CT imaging of the head and cervical spine was performed following the standard protocol without intravenous contrast. Multiplanar CT image reconstructions of the cervical spine were also generated. RADIATION DOSE REDUCTION: This exam was performed according to the departmental dose-optimization program which includes automated exposure control, adjustment of the mA and/or kV according to patient size and/or use of iterative reconstruction technique. COMPARISON:  None. FINDINGS: CT HEAD FINDINGS Brain: No age advanced or lobar predominant parenchymal atrophy. There is no acute intracranial hemorrhage. No demarcated cortical infarct. No extra-axial fluid collection. No evidence of an intracranial mass. No midline shift. Vascular: No hyperdense vessel.  Atherosclerotic calcifications. Skull: No calvarial fracture. 5 mm osseous protrusion projecting outward from the midline frontal calvarium compatible with a  small osteoma (series 2, image 16). Sinuses/Orbits: No mass or acute finding within the imaged orbits. Mucosal thickening within a posterior left ethmoid air cell. CT CERVICAL SPINE FINDINGS Alignment: Dextrocurvature of the cervical spine. Mild C6-C7 grade 1 retrolisthesis. Skull base and vertebrae: The basion-dental and atlanto-dental intervals are maintained.No evidence of acute fracture to the cervical spine. Soft tissues and spinal canal: No paraspinal mass or collection. Disc levels: No more than mild disc degeneration within the cervical spine. Small ventral osteophytes at C4-C5, C5-C6 and C6-C7. C2-C3: No significant spinal canal stenosis. No significant bony neural foraminal narrowing. C3-C4: Shallow disc bulge. 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 Mild relative spinal canal narrowing. No significant bony neural foraminal narrowing. C4-C5: Shallow disc bulge. No significant spinal canal stenosis. No significant bony neural foraminal narrowing. C5-C6: Shallow disc bulge. No significant spinal canal stenosis. No significant bony neural foraminal narrowing. C6-C7:  Slight grade 1 retrolisthesis. Shallow disc bulge. Bilateral degenerative facet spurring. No significant spinal canal stenosis. Mild bony neural foraminal narrowing on the right. C7-T1: Degenerative facet spurring. No significant spinal canal stenosis. No significant bony neural foraminal narrowing. Upper chest: No consolidation within the imaged lung apices. IMPRESSION: CT head: 1. No evidence of an acute intracranial abnormality. 2. 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 3. Mild left ethmoid sinus disease. CT cervical spine: 1. Cervical spondylosis as outlined. 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 Mild spinal canal narrowing at C3-C4. Mild bony neural foraminal narrowing on the right at C6-C7. 2. Dextrocurvature of the cervical spine. 3. Mild C6-C7  grade 1 retrolisthesis. Electronically Signed   By: Jackey Loge D.O.   On: 04/08/2023 11:31    Procedures Procedures    Medications Ordered in ED Medications  lidocaine (LIDODERM) 5 % 1 patch (1 patch Transdermal Patch Applied 04/08/23 1231)  dexamethasone (DECADRON) injection 10 mg (10 mg Intravenous Given 04/08/23 1104)  prochlorperazine (COMPAZINE) injection 10 mg (10 mg Intravenous Given 04/08/23 1104)  diphenhydrAMINE (BENADRYL) injection 12.5 mg (12.5 mg Intravenous Given 04/08/23 1103)    ED Course/ Medical Decision Making/ A&P                   NIH Stroke Scale: 1          Medical Decision Making Patient presenting with waxing waning headache along with tingling sensation in his right upper and lower extremities since yesterday.  He has no other neuro deficits, coordination is intact, strength is intact.  Basic labs and CT imaging has been ordered.  In the interim we will give him migraine cocktail and IV fluids.  After receiving fluids along with Decadron, Benadryl and Compazine, his headache completely resolved, still had some complaint of mild soreness at the right side of his cervical spine, no spasm appreciated.  Presentation and exam suggesting a peripheral source of tingling, possibly secondary to torticollis, although this also could be a new migraine presentation.  He has no symptoms to suggest acute CVA or TIA.  Amount and/or Complexity of Data Reviewed Labs: ordered.    Details: Be met in CBC are normal Radiology: ordered.    Details: CT head negative for acute findings, CT C-spine with cervical spondylosis, mild spinal canal narrowing at C3-C4.  Suggest possible mechanical source of symptoms, cervical radiculopathy/torticollis.  Patient will be placed on a prednisone taper, Lidoderm patch for pain relief, plan follow-up with his new primary provider for recheck if symptoms or not improving over the next week.  We discussed his CT cervical spine findings, he may need further  imaging and a neurosurgical specialist if he has persistent or worsening symptoms.  Risk Prescription drug management.           Final Clinical Impression(s) / ED Diagnoses Final diagnoses:  Acute nonintractable headache, unspecified headache type  Torticollis    Rx / DC Orders ED Discharge Orders          Ordered    predniSONE (DELTASONE) 10 MG tablet        04/08/23 1206    lidocaine (LIDODERM) 5 %  Every 24 hours        04/08/23 1206              Burgess Amor, PA-C 04/08/23 1751    Kommor, Wyn Forster, MD 04/09/23 [id number]

## 2023-04-08 NOTE — ED Notes (Signed)
MD at bedside to assess pt.

## 2024-03-30 ENCOUNTER — Other Ambulatory Visit: Payer: Self-pay

## 2024-11-04 ENCOUNTER — Encounter (INDEPENDENT_AMBULATORY_CARE_PROVIDER_SITE_OTHER): Payer: Self-pay | Admitting: *Deleted
